# Patient Record
Sex: Male | Born: 1935 | Race: White | Hispanic: No | Marital: Married | Smoking: Never smoker
Health system: Southern US, Community
[De-identification: ages and names within clinical notes are randomized; demographics above are authoritative.]

## PROBLEM LIST (undated history)

## (undated) DIAGNOSIS — N189 Chronic kidney disease, unspecified: Secondary | ICD-10-CM

## (undated) DIAGNOSIS — I10 Essential (primary) hypertension: Secondary | ICD-10-CM

## (undated) DIAGNOSIS — E119 Type 2 diabetes mellitus without complications: Secondary | ICD-10-CM

## (undated) DIAGNOSIS — R011 Cardiac murmur, unspecified: Secondary | ICD-10-CM

## (undated) DIAGNOSIS — C801 Malignant (primary) neoplasm, unspecified: Secondary | ICD-10-CM

## (undated) DIAGNOSIS — E785 Hyperlipidemia, unspecified: Secondary | ICD-10-CM

## (undated) DIAGNOSIS — I82409 Acute embolism and thrombosis of unspecified deep veins of unspecified lower extremity: Secondary | ICD-10-CM

## (undated) HISTORY — DX: Acute embolism and thrombosis of unspecified deep veins of unspecified lower extremity: I82.409

## (undated) HISTORY — DX: Essential (primary) hypertension: I10

## (undated) HISTORY — PX: PROSTATE SURGERY: SHX751

## (undated) HISTORY — PX: REPLACEMENT TOTAL KNEE: SUR1224

## (undated) HISTORY — DX: Hyperlipidemia, unspecified: E78.5

## (undated) HISTORY — DX: Malignant (primary) neoplasm, unspecified: C80.1

---

## 2002-05-26 ENCOUNTER — Ambulatory Visit (HOSPITAL_BASED_OUTPATIENT_CLINIC_OR_DEPARTMENT_OTHER): Admission: RE | Admit: 2002-05-26 | Discharge: 2002-05-26 | Payer: Self-pay | Admitting: Plastic Surgery

## 2010-11-16 DIAGNOSIS — I824Z9 Acute embolism and thrombosis of unspecified deep veins of unspecified distal lower extremity: Secondary | ICD-10-CM

## 2010-11-16 DIAGNOSIS — Z86711 Personal history of pulmonary embolism: Secondary | ICD-10-CM

## 2010-11-16 HISTORY — DX: Personal history of pulmonary embolism: Z86.711

## 2010-11-16 HISTORY — DX: Acute embolism and thrombosis of unspecified deep veins of unspecified distal lower extremity: I82.4Z9

## 2021-12-19 ENCOUNTER — Other Ambulatory Visit (HOSPITAL_COMMUNITY): Payer: Self-pay | Admitting: Specialist

## 2021-12-19 DIAGNOSIS — Z86718 Personal history of other venous thrombosis and embolism: Secondary | ICD-10-CM

## 2021-12-22 ENCOUNTER — Ambulatory Visit (HOSPITAL_COMMUNITY)
Admission: RE | Admit: 2021-12-22 | Discharge: 2021-12-22 | Disposition: A | Discharge status: Home / Self Care | Payer: Medicare HMO | Source: Ambulatory Visit | Attending: Cardiology | Admitting: Cardiology

## 2021-12-22 ENCOUNTER — Other Ambulatory Visit: Payer: Self-pay

## 2021-12-22 DIAGNOSIS — Z86718 Personal history of other venous thrombosis and embolism: Secondary | ICD-10-CM | POA: Diagnosis not present

## 2021-12-22 NOTE — Progress Notes (Signed)
[facility] OF [place]  ASSESSMENT / PLAN: 86 y.o. male with history of deep venous thrombosis, with plans to undergo neurosurgery in the near future.  Patient was offered a temporary inferior vena cava filter, we will place this Friday 12/26/21.  I counseled him that the filter should be removed as soon as possible (i.e. when he is ambulatory and recovered from a surgical standpoint).  He should receive pharmacologic DVT prophylaxis while in the hospital at [name] discretion.  CHIEF COMPLAINT: History of DVT/PE.  Plans for back surgery  HISTORY OF PRESENT ILLNESS: Ivan [name] is a 86 y.o. male with left lower extremity weakness from spinal stenosis.  He is planned to undergo surgical correction with Dr. Maxie Better in the near future.  He has a history of DVT and PE after orthopedic surgery in the past.  He was treated successfully with anticoagulation.  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  I was asked to evaluate the patient to consider temporary inferior vena cava filter placement.  I think the patient is a good candidate for this.  I explained the rationale, risks, benefits, and alternatives to IVC filter placement.  The patient is understanding and amenable with proceeding.  Past Medical History:  Diagnosis Date   Cancer ([place])    DVT (deep venous thrombosis) ([place])    Hyperlipidemia    Hypertension     Past Surgical History:  Procedure Laterality Date   PROSTATE SURGERY N/A    REPLACEMENT TOTAL KNEE Bilateral     History reviewed. No pertinent family history.  Social History   Socioeconomic History   Marital status: Unknown    Spouse name: Not on file   Number of children: Not on file   Years of education: Not on file   Highest education level: Not on file  Occupational History   Not on file  Tobacco Use   Smoking status: Never   Smokeless tobacco: Never  Substance and Sexual Activity   Alcohol use: Not  Currently   Drug use: Not on file   Sexual activity: Not on file  Other Topics Concern   Not on file  Social History Narrative   Not on file   Social Determinants of Health   Financial Resource Strain: Not on file  Food Insecurity: Not on file  Transportation Needs: Not on file  Physical Activity: Not on file  Stress: Not on file  Social Connections: Not on file  Intimate Partner Violence: Not on file    Allergies  Allergen Reactions   Oxycontin [Oxycodone]     Current Outpatient Medications  Medication Sig Dispense Refill   amLODipine (NORVASC) 10 MG tablet Take 10 mg by mouth daily.     ASPIRIN 81 PO aspirin 81 mg capsule  Take 1 capsule every day by oral route.     atorvastatin (LIPITOR) 20 MG tablet Take 20 mg by mouth daily.     carvedilol (COREG) 25 MG tablet Take 25 mg by mouth 2 (two) times daily.     dorzolamide (TRUSOPT) 2 % ophthalmic solution 1 drop 2 (two) times daily.     glimepiride (AMARYL) 4 MG tablet Take 4 mg by mouth 2 (two) times daily.     hydrochlorothiazide (HYDRODIURIL) 25 MG tablet Take 25 mg by mouth daily.     irbesartan (AVAPRO) 300 MG tablet Take 300 mg by mouth daily.     latanoprost (XALATAN) 0.005 % ophthalmic solution 1 drop  at bedtime.     losartan (COZAAR) 100 MG tablet Take 100 mg by mouth daily.     timolol (TIMOPTIC) 0.5 % ophthalmic solution 1 drop 2 (two) times daily.     No current facility-administered medications for this visit.    REVIEW OF SYSTEMS:  [X]  denotes positive finding, [ ]  denotes negative finding Cardiac  Comments:  Chest pain or chest pressure:    Shortness of breath upon exertion:    Short of breath when lying flat:    Irregular heart rhythm:        Vascular    Pain in calf, thigh, or hip brought on by ambulation:    Pain in feet at night that wakes you up from your sleep:     Blood clot in your veins:    Leg swelling:         Pulmonary    Oxygen at home:    Productive cough:     Wheezing:          Neurologic    Sudden weakness in arms or legs:     Sudden numbness in arms or legs:     Sudden onset of difficulty speaking or slurred speech:    Temporary loss of vision in one eye:     Problems with dizziness:         Gastrointestinal    Blood in stool:     Vomited blood:         Genitourinary    Burning when urinating:     Blood in urine:        Psychiatric    Major depression:         Hematologic    Bleeding problems:    Problems with blood clotting too easily:        Skin    Rashes or ulcers:        Constitutional    Fever or chills:      PHYSICAL EXAM Vitals:   12/23/21 1258  BP: 134/78  Pulse: 60  Resp: 20  Temp: 98.5 F (36.9 C)  SpO2: 97%  Weight: 215 lb (97.5 kg)  Height: 5\' 9"  (1.753 m)    Constitutional: well appearing in no distress. Appears well nourished.  Neurologic: CN intact. Left leg weak with hip flexion. Psychiatric: Mood and affect symmetric and appropriate. Eyes: No icterus. No conjunctival pallor. Ears, nose, throat: mucous membranes moist. Midline trachea.  Cardiac: regular rate and rhythm.  Respiratory: unlabored. Abdominal: soft, non-tender, non-distended.  Extremity: No edema. No cyanosis. No pallor.  Skin: No gangrene. No ulceration.  Lymphatic: No Stemmer's sign. No palpable lymphadenopathy.  PERTINENT LABORATORY AND RADIOLOGIC DATA Bilateral lower extremity venous duplex RIGHT:  - No evidence of deep vein thrombosis in the lower extremity. No indirect  evidence of obstruction proximal to the inguinal ligament.  - No cystic structure found in the popliteal fossa.  - Rouleau flow noted in the popliteal vein.     LEFT:  - Findings consistent with chronic deep vein thrombosis involving the left  femoral vein.  - No cystic structure found in the popliteal fossa.      *See table(s) above for measurements and observations.   [name]. Stanford Breed, MD [facility] Phone Number: [phone] 12/23/2021 1:30 PM  Total time spent on preparing this encounter including chart review, data review, collecting history, examining the patient, coordinating care for this new patient, 45 minutes.  Portions of this report  may have been transcribed using voice recognition software.  Every effort has been made to ensure accuracy; however, inadvertent computerized transcription errors may still be present.

## 2021-12-22 NOTE — H&P (View-Only) (Signed)
[facility] OF [place]  ASSESSMENT / PLAN: 86 y.o. male with history of deep venous thrombosis, with plans to undergo neurosurgery in the near future.  Patient was offered a temporary inferior vena cava filter, we will place this Friday 12/26/21.  I counseled him that the filter should be removed as soon as possible (i.e. when he is ambulatory and recovered from a surgical standpoint).  He should receive pharmacologic DVT prophylaxis while in the hospital at [name] discretion.  CHIEF COMPLAINT: History of DVT/PE.  Plans for back surgery  HISTORY OF PRESENT ILLNESS: Ivan [name] is a 86 y.o. male with left lower extremity weakness from spinal stenosis.  He is planned to undergo surgical correction with Dr. Maxie Better in the near future.  He has a history of DVT and PE after orthopedic surgery in the past.  He was treated successfully with anticoagulation.  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  I was asked to evaluate the patient to consider temporary inferior vena cava filter placement.  I think the patient is a good candidate for this.  I explained the rationale, risks, benefits, and alternatives to IVC filter placement.  The patient is understanding and amenable with proceeding.  Past Medical History:  Diagnosis Date   Cancer ([place])    DVT (deep venous thrombosis) ([place])    Hyperlipidemia    Hypertension     Past Surgical History:  Procedure Laterality Date   PROSTATE SURGERY N/A    REPLACEMENT TOTAL KNEE Bilateral     History reviewed. No pertinent family history.  Social History   Socioeconomic History   Marital status: Unknown    Spouse name: Not on file   Number of children: Not on file   Years of education: Not on file   Highest education level: Not on file  Occupational History   Not on file  Tobacco Use   Smoking status: Never   Smokeless tobacco: Never  Substance and Sexual Activity   Alcohol use: Not  Currently   Drug use: Not on file   Sexual activity: Not on file  Other Topics Concern   Not on file  Social History Narrative   Not on file   Social Determinants of Health   Financial Resource Strain: Not on file  Food Insecurity: Not on file  Transportation Needs: Not on file  Physical Activity: Not on file  Stress: Not on file  Social Connections: Not on file  Intimate Partner Violence: Not on file    Allergies  Allergen Reactions   Oxycontin [Oxycodone]     Current Outpatient Medications  Medication Sig Dispense Refill   amLODipine (NORVASC) 10 MG tablet Take 10 mg by mouth daily.     ASPIRIN 81 PO aspirin 81 mg capsule  Take 1 capsule every day by oral route.     atorvastatin (LIPITOR) 20 MG tablet Take 20 mg by mouth daily.     carvedilol (COREG) 25 MG tablet Take 25 mg by mouth 2 (two) times daily.     dorzolamide (TRUSOPT) 2 % ophthalmic solution 1 drop 2 (two) times daily.     glimepiride (AMARYL) 4 MG tablet Take 4 mg by mouth 2 (two) times daily.     hydrochlorothiazide (HYDRODIURIL) 25 MG tablet Take 25 mg by mouth daily.     irbesartan (AVAPRO) 300 MG tablet Take 300 mg by mouth daily.     latanoprost (XALATAN) 0.005 % ophthalmic solution 1 drop  at bedtime.     losartan (COZAAR) 100 MG tablet Take 100 mg by mouth daily.     timolol (TIMOPTIC) 0.5 % ophthalmic solution 1 drop 2 (two) times daily.     No current facility-administered medications for this visit.    REVIEW OF SYSTEMS:  [X]  denotes positive finding, [ ]  denotes negative finding Cardiac  Comments:  Chest pain or chest pressure:    Shortness of breath upon exertion:    Short of breath when lying flat:    Irregular heart rhythm:        Vascular    Pain in calf, thigh, or hip brought on by ambulation:    Pain in feet at night that wakes you up from your sleep:     Blood clot in your veins:    Leg swelling:         Pulmonary    Oxygen at home:    Productive cough:     Wheezing:          Neurologic    Sudden weakness in arms or legs:     Sudden numbness in arms or legs:     Sudden onset of difficulty speaking or slurred speech:    Temporary loss of vision in one eye:     Problems with dizziness:         Gastrointestinal    Blood in stool:     Vomited blood:         Genitourinary    Burning when urinating:     Blood in urine:        Psychiatric    Major depression:         Hematologic    Bleeding problems:    Problems with blood clotting too easily:        Skin    Rashes or ulcers:        Constitutional    Fever or chills:      PHYSICAL EXAM Vitals:   12/23/21 1258  BP: 134/78  Pulse: 60  Resp: 20  Temp: 98.5 F (36.9 C)  SpO2: 97%  Weight: 215 lb (97.5 kg)  Height: 5\' 9"  (1.753 m)    Constitutional: well appearing in no distress. Appears well nourished.  Neurologic: CN intact. Left leg weak with hip flexion. Psychiatric: Mood and affect symmetric and appropriate. Eyes: No icterus. No conjunctival pallor. Ears, nose, throat: mucous membranes moist. Midline trachea.  Cardiac: regular rate and rhythm.  Respiratory: unlabored. Abdominal: soft, non-tender, non-distended.  Extremity: No edema. No cyanosis. No pallor.  Skin: No gangrene. No ulceration.  Lymphatic: No Stemmer's sign. No palpable lymphadenopathy.  PERTINENT LABORATORY AND RADIOLOGIC DATA Bilateral lower extremity venous duplex RIGHT:  - No evidence of deep vein thrombosis in the lower extremity. No indirect  evidence of obstruction proximal to the inguinal ligament.  - No cystic structure found in the popliteal fossa.  - Rouleau flow noted in the popliteal vein.     LEFT:  - Findings consistent with chronic deep vein thrombosis involving the left  femoral vein.  - No cystic structure found in the popliteal fossa.      *See table(s) above for measurements and observations.   Ivan [name]. Stanford Breed, MD [facility] Phone Number: [phone] 12/23/2021 1:30 PM  Total time spent on preparing this encounter including chart review, data review, collecting history, examining the patient, coordinating care for this new patient, 45 minutes.  Portions of this report  may have been transcribed using voice recognition software.  Every effort has been made to ensure accuracy; however, inadvertent computerized transcription errors may still be present.

## 2021-12-23 ENCOUNTER — Other Ambulatory Visit: Payer: Self-pay

## 2021-12-23 ENCOUNTER — Encounter: Payer: Self-pay | Admitting: Vascular Surgery

## 2021-12-23 ENCOUNTER — Ambulatory Visit: Payer: Medicare HMO | Admitting: Vascular Surgery

## 2021-12-23 VITALS — BP 134/78 | HR 60 | Temp 98.5°F | Resp 20 | Ht 69.0 in | Wt 215.0 lb

## 2021-12-23 DIAGNOSIS — Z86718 Personal history of other venous thrombosis and embolism: Secondary | ICD-10-CM | POA: Diagnosis not present

## 2021-12-26 ENCOUNTER — Ambulatory Visit (HOSPITAL_COMMUNITY)
Admission: RE | Admit: 2021-12-26 | Discharge: 2021-12-26 | Disposition: A | Discharge status: Home / Self Care | Payer: Medicare HMO | Attending: Vascular Surgery | Admitting: Vascular Surgery

## 2021-12-26 ENCOUNTER — Other Ambulatory Visit: Payer: Self-pay

## 2021-12-26 ENCOUNTER — Encounter (HOSPITAL_COMMUNITY)
Admission: RE | Disposition: A | Discharge status: Home / Self Care | Payer: Self-pay | Source: Home / Self Care | Attending: Vascular Surgery

## 2021-12-26 DIAGNOSIS — Z86718 Personal history of other venous thrombosis and embolism: Secondary | ICD-10-CM | POA: Insufficient documentation

## 2021-12-26 DIAGNOSIS — M4800 Spinal stenosis, site unspecified: Secondary | ICD-10-CM | POA: Diagnosis not present

## 2021-12-26 DIAGNOSIS — Z408 Encounter for other prophylactic surgery: Secondary | ICD-10-CM | POA: Insufficient documentation

## 2021-12-26 HISTORY — PX: IVC VENOGRAPHY: CATH118301

## 2021-12-26 HISTORY — PX: IVC FILTER INSERTION: CATH118245

## 2021-12-26 LAB — POCT I-STAT, CHEM 8
BUN: 27 mg/dL — ABNORMAL HIGH (ref 8–23)
Calcium, Ion: 1.08 mmol/L — ABNORMAL LOW (ref 1.15–1.40)
Chloride: 96 mmol/L — ABNORMAL LOW (ref 98–111)
Creatinine, Ser: 1.5 mg/dL — ABNORMAL HIGH (ref 0.61–1.24)
Glucose, Bld: 254 mg/dL — ABNORMAL HIGH (ref 70–99)
HCT: 36 % — ABNORMAL LOW (ref 39.0–52.0)
Hemoglobin: 12.2 g/dL — ABNORMAL LOW (ref 13.0–17.0)
Potassium: 3.3 mmol/L — ABNORMAL LOW (ref 3.5–5.1)
Sodium: 132 mmol/L — ABNORMAL LOW (ref 135–145)
TCO2: 25 mmol/L (ref 22–32)

## 2021-12-26 SURGERY — IVC FILTER INSERTION
Anesthesia: LOCAL

## 2021-12-26 MED ORDER — FENTANYL CITRATE (PF) 100 MCG/2ML IJ SOLN
INTRAMUSCULAR | Status: AC
  Filled 2021-12-26: qty 2

## 2021-12-26 MED ORDER — ONDANSETRON HCL 4 MG/2ML IJ SOLN: 4.0000 mg | Freq: Four times a day (QID) | INTRAMUSCULAR | Status: DC | PRN

## 2021-12-26 MED ORDER — SODIUM CHLORIDE 0.9 % WEIGHT BASED INFUSION: 1.0000 mL/kg/h | INTRAVENOUS | Status: DC

## 2021-12-26 MED ORDER — HEPARIN (PORCINE) IN NACL 1000-0.9 UT/500ML-% IV SOLN
INTRAVENOUS | Status: DC | PRN
  Administered 2021-12-26: 500 mL

## 2021-12-26 MED ORDER — HYDRALAZINE HCL 20 MG/ML IJ SOLN: 5.0000 mg | INTRAMUSCULAR | Status: DC | PRN

## 2021-12-26 MED ORDER — ACETAMINOPHEN 325 MG PO TABS: 650.0000 mg | ORAL_TABLET | ORAL | Status: DC | PRN

## 2021-12-26 MED ORDER — MIDAZOLAM HCL 2 MG/2ML IJ SOLN
INTRAMUSCULAR | Status: DC | PRN
  Administered 2021-12-26: 1 mg via INTRAVENOUS

## 2021-12-26 MED ORDER — IODIXANOL 320 MG/ML IV SOLN
INTRAVENOUS | Status: DC | PRN
  Administered 2021-12-26: 50 mL

## 2021-12-26 MED ORDER — LIDOCAINE HCL (PF) 1 % IJ SOLN
INTRAMUSCULAR | Status: DC | PRN
  Administered 2021-12-26: 5 mL

## 2021-12-26 MED ORDER — MIDAZOLAM HCL 2 MG/2ML IJ SOLN
INTRAMUSCULAR | Status: AC
  Filled 2021-12-26: qty 2

## 2021-12-26 MED ORDER — SODIUM CHLORIDE 0.9% FLUSH: 3.0000 mL | Freq: Two times a day (BID) | INTRAVENOUS | Status: DC

## 2021-12-26 MED ORDER — HEPARIN (PORCINE) IN NACL 1000-0.9 UT/500ML-% IV SOLN
INTRAVENOUS | Status: AC
  Filled 2021-12-26: qty 500

## 2021-12-26 MED ORDER — LABETALOL HCL 5 MG/ML IV SOLN: 10.0000 mg | INTRAVENOUS | Status: DC | PRN

## 2021-12-26 MED ORDER — SODIUM CHLORIDE 0.9% FLUSH: 3.0000 mL | INTRAVENOUS | Status: DC | PRN

## 2021-12-26 MED ORDER — LIDOCAINE HCL (PF) 1 % IJ SOLN
INTRAMUSCULAR | Status: AC
  Filled 2021-12-26: qty 30

## 2021-12-26 MED ORDER — SODIUM CHLORIDE 0.9 % IV SOLN: 250.0000 mL | INTRAVENOUS | Status: DC | PRN

## 2021-12-26 MED ORDER — SODIUM CHLORIDE 0.9 % IV SOLN: INTRAVENOUS | Status: DC

## 2021-12-26 MED ORDER — FENTANYL CITRATE (PF) 100 MCG/2ML IJ SOLN
INTRAMUSCULAR | Status: DC | PRN
  Administered 2021-12-26: 50 ug via INTRAVENOUS

## 2021-12-26 SURGICAL SUPPLY — 16 items
BAG SNAP BAND KOVER 36X36 (MISCELLANEOUS) ×1 IMPLANT
CATH ANGIO 5F BER2 65CM (CATHETERS) ×1 IMPLANT
FILTER CELECT-JUGULAR (Filter) ×1 IMPLANT
GUIDEWIRE ANGLED .035X150CM (WIRE) ×1 IMPLANT
KIT MICROPUNCTURE NIT STIFF (SHEATH) ×1 IMPLANT
KIT PV (KITS) ×2 IMPLANT
SHEATH PINNACLE 5F 10CM (SHEATH) ×1 IMPLANT
SHEATH PROBE COVER 6X72 (BAG) ×1 IMPLANT
STOPCOCK MORSE 400PSI 3WAY (MISCELLANEOUS) ×1 IMPLANT
SYR MEDRAD MARK 7 150ML (SYRINGE) ×2 IMPLANT
TRANSDUCER W/STOPCOCK (MISCELLANEOUS) ×2 IMPLANT
TRAY PV CATH (CUSTOM PROCEDURE TRAY) ×2 IMPLANT
TUBING CIL FLEX 10 FLL-RA (TUBING) ×1 IMPLANT
TUBING CONTRAST HIGH PRESS 48 (TUBING) ×1 IMPLANT
WIRE BENTSON .035X145CM (WIRE) ×1 IMPLANT
WIRE ROSEN-J .035X260CM (WIRE) ×1 IMPLANT

## 2021-12-26 NOTE — Op Note (Signed)
DATE OF SERVICE: 12/26/2021  PATIENT:  Ivan [name]  86 y.o. male  PRE-OPERATIVE DIAGNOSIS:  history of DVT, plan for back surgery in near future  POST-OPERATIVE DIAGNOSIS:  Same  PROCEDURE:   1) Ultrasound guided right internal jugular vein access 2) Central venogram (30m total contrast) 3) Placement of inferior vena cava filter (Cook Celect) 4) Conscious sedation (31 minutes)  SURGEON:  TYevonne Aline HStanford Breed MD  ASSISTANT: none  ANESTHESIA:   local and IV sedation  ESTIMATED BLOOD LOSS: minimal  LOCAL MEDICATIONS USED:  LIDOCAINE   COUNTS: confirmed correct.  PATIENT DISPOSITION:  PACU - hemodynamically stable.   Delay start of Pharmacological VTE agent (>24hrs) due to surgical blood loss or risk of bleeding: no  INDICATION FOR PROCEDURE: Ivan Mceversis a 86y.o. male with history of deep venous thrombosis and pulmonary embolism. He needs to undergo spinal surgery. After careful discussion of risks, benefits, and alternatives the patient was offered inferior vena cava filter placement. The patient understood and wished to proceed.  OPERATIVE FINDINGS:  Unremarkable central venogram.  IVC filter placed into low IVC just above confluence of iliac veins.  DESCRIPTION OF PROCEDURE: After identification of the patient in the pre-operative holding area, the patient was transferred to the operating room. The patient was positioned supine on the operating room table. Anesthesia was induced. The groins was prepped and draped in standard fashion. A surgical pause was performed confirming correct patient, procedure, and operative location.  The right neck was anesthetized with subcutaneous injection of 1% lidocaine. Using ultrasound guidance, the right jugular vein was accessed with micropuncture technique. Fluoroscopy was used to confirm cannulation over the femoral head. The 28F sheath was upsized to 89F.   A Benson wire was advanced into the distal IVC. The sheath was removed.  A  Cook IVC filter kit was brought to the field and prepped further [employer]instructions.  The system was delivered over the wire into the inferior vena cava.  A central venogram was performed.  This confirmed position and size of the inferior vena cava was appropriate for the system.  The dilator was then removed.  Through this sheath, the inferior vena cava filter was delivered into the low inferior vena cava.  The sheath was slowly withdrawn.  The filter was placed without difficulty and without tilt.  A follow-up venogram was performed.  This showed the filter in good position.  Satisfied we removed all endovascular equipment.  Manual pressure was held over the access site until hemostasis was achieved.  A sterile bandage was applied.  Conscious sedation was administered with the use of IV fentanyl and midazolam under continuous physician and nurse monitoring.  Heart rate, blood pressure, and oxygen saturation were continuously monitored.  Total sedation time was 31 minutes  Upon completion of the case instrument and sharps counts were confirmed correct. The patient was transferred to the PACU in good condition. I was present for all portions of the procedure.  PLAN: Okay to proceed with spinal surgery at any point from my standpoint.  Patient should start pharmacologic DVT prophylaxis as soon as possible after surgery at Dr. BReather Littlerdiscretion.  We will make an appointment for IVC filter retrieval in 3 months time.  TYevonne Aline HStanford Breed MD [facility]: ([phone]/08/2022 1:02 PM

## 2021-12-26 NOTE — Interval H&P Note (Signed)
History and Physical Interval Note:  12/26/2021 12:58 PM  Ivan [name]  has presented today for surgery, with the diagnosis of history of dvt.  The various methods of treatment have been discussed with the patient and family. After consideration of risks, benefits and other options for treatment, the patient has consented to  Procedure(s): IVC FILTER INSERTION (N/A) as a surgical intervention.  The patient's history has been reviewed, patient examined, no change in status, stable for surgery.  I have reviewed the patient's chart and labs.  Questions were answered to the patient's satisfaction.     Cherre Robins

## 2021-12-29 ENCOUNTER — Encounter (HOSPITAL_COMMUNITY): Payer: Self-pay | Admitting: Vascular Surgery

## 2022-01-07 ENCOUNTER — Other Ambulatory Visit: Payer: Self-pay

## 2022-01-07 ENCOUNTER — Ambulatory Visit: Payer: Medicare HMO | Admitting: Cardiology

## 2022-01-07 ENCOUNTER — Encounter: Payer: Self-pay | Admitting: Cardiology

## 2022-01-07 VITALS — BP 158/60 | HR 65 | Ht 69.0 in | Wt 212.8 lb

## 2022-01-07 DIAGNOSIS — E1169 Type 2 diabetes mellitus with other specified complication: Secondary | ICD-10-CM | POA: Insufficient documentation

## 2022-01-07 DIAGNOSIS — Z01810 Encounter for preprocedural cardiovascular examination: Secondary | ICD-10-CM | POA: Insufficient documentation

## 2022-01-07 DIAGNOSIS — R011 Cardiac murmur, unspecified: Secondary | ICD-10-CM | POA: Diagnosis not present

## 2022-01-07 DIAGNOSIS — I452 Bifascicular block: Secondary | ICD-10-CM

## 2022-01-07 DIAGNOSIS — E785 Hyperlipidemia, unspecified: Secondary | ICD-10-CM

## 2022-01-07 DIAGNOSIS — I10 Essential (primary) hypertension: Secondary | ICD-10-CM | POA: Insufficient documentation

## 2022-01-07 DIAGNOSIS — I82502 Chronic embolism and thrombosis of unspecified deep veins of left lower extremity: Secondary | ICD-10-CM | POA: Insufficient documentation

## 2022-01-07 NOTE — Patient Instructions (Addendum)
Medication Instructions:   No changes  *If you need a refill on your cardiac medications before your next appointment, please call your pharmacy*   Lab Work: Not needed    Testing/Procedures:  Will be schedule at [place] has requested that you have an echocardiogram. Echocardiography is a painless test that uses sound waves to create images of your heart. It provides your doctor with information about the size and shape of your heart and how well your hearts chambers and valves are working. This procedure takes approximately one hour. There are no restrictions for this procedure.    Follow-Up: At [facility], you and your health needs are our priority.  As part of our continuing mission to provide you with exceptional heart care, we have created designated Provider Care Teams.  These Care Teams include your primary Cardiologist (physician) and Advanced Practice Providers (APPs -  Physician Assistants and Nurse Practitioners) who all work together to provide you with the care you need, when you need it.  We recommend signing up for the patient portal called "MyChart".  Sign up information is provided on this After Visit Summary.  MyChart is used to connect with patients for Virtual Visits (Telemedicine).  Patients are able to view lab/test results, encounter notes, upcoming appointments, etc.  Non-urgent messages can be sent to your provider as well.   To learn more about what you can do with MyChart, go to [email or web address].    Your next appointment:    1 month(s)  The format for your next appointment:   In Person  Provider:   [name], MD

## 2022-01-07 NOTE — Progress Notes (Signed)
Primary Care Provider: Wayland Salinas, MD [facility] Cardiologist: [name], MD Electrophysiologist: None  Clinic Note: Chief Complaint  Patient presents with   Pre-op Exam    Upcoming back surgery for spinal stenosis.   Abnormal ECG    Bifascicular block on EKG-not new    ===================================  ASSESSMENT/PLAN   Problem List Items Addressed This Visit       Cardiology Problems   Bifascicular bundle branch block (Chronic)    Conduction delay noted.  This does not seem to be new.  Was described in 2018 as well.  We are checking 2D echo just to make sure there is no structural abnormalities to explain it.  With 1 AV block, this could be explained by the beta-blocker.  He has been on a stable dose for a long time.  Would probably not change.  Simply need to monitor for signs of complete block.  No current indication for pacemaker placement.      Relevant Orders   EKG 12-Lead (Completed)   ECHOCARDIOGRAM COMPLETE   Essential hypertension (Chronic)    His pressures seem to be somewhat up-and-down.  Better at home and better at recent clinic visit.  On high doses of amlodipine, carvedilol and irbesartan along with increased dose of HCTZ.  Based on the fact that he does have some unsteady gait with spinal stenosis, and does have documented blood pressure range ranging from the 120s to 150s, I would not want to be more aggressive than this.      Hyperlipidemia associated with type 2 diabetes mellitus ([place]) (Chronic)    Lipids have been pretty well controlled, not sure what happened between beginning and end of 2022.  However he seems to be on stable dose of atorvastatin.  Labs being followed by PCP.  Stable.      Leg DVT (deep venous thromboembolism), chronic, left (HCC) (Chronic)    Chronic left femoral vein thrombosis.  Not unexpectedly there is evidence of DVT because he never treated had full [place] treatment.  Now status post IVC  filter.  86 year old DVT is likely not mobile, probably more laminated.  Likely not an indication for anticoagulation going forward.  Simply continue aspirin 81 mg which is okay to hold 5 days prior to procedures or surgeries.        Other   Systolic ejection murmur    Systolic ejection murmur heard by exam, sounds like is probably an aortic murmur.  Just need to determine if it is simply aortic sclerosis versus significant stenosis.  Plan: Check 2D echocardiogram as part of preop evaluation.      Preoperative cardiovascular examination    86 year old gentleman by definition has an underlying risk with age for anesthesia related difficulties.  However, from a cardiac standpoint, he really has no significant risk factors.  Although he has renal insufficiency, creatinine is less than 2 and stable.  He has diabetes but not on insulin.  No active angina or CHF symptoms (only trivial left leg only swelling).  No history of stroke or documented cerebrovascular disease. Planned surgery is spinal, and not likely to put undue stress on the heart from a vascular standpoint.  As such, in a patient without active cardiac symptoms I would not recommend any ischemic evaluation as it may only serve to delay unnecessary procedure.  In the absence of surgery, I would not check a stress test, therefore no requirement at this point. I am checking a 2D echocardiogram because of the murmur  just to ensure that is not significant aortic stenosis which would adjust his restratification.  This will also give [place] a sense of the ejection fraction and any potential wall motion abnormalities.  Provided the echocardiogram is relatively normal, I see no reason for him not to proceed with his upcoming surgery.  No further ischemic evaluation warranted unless echocardiogram findings warrant.      Other Visit Diagnoses     Murmur    -  Primary   Relevant Orders   EKG 12-Lead (Completed)   ECHOCARDIOGRAM COMPLETE        ===================================  HPI:    Frederic Tones is a 86 y.o. male With History of Chronic Left Femoral Vein DVT (original DVT-PE in 2012) and Spinal Stenosis is being seen today for the preop evaluation at the request of Susa Day, MD. -> Recent history of DVT with plans undergo neurosurgery in the near future.  Adger Cantera was seen on December 04, 2021 by his PCP.  He had been having some issues with blood pressure, and HCTZ was increased to 25 mg daily Labs are being evaluated because of mild exacerbation of CKD that is stabilized. => Converted from losartan to irbesartan 300 mg.  Following this, he was seen by Dr. [name] for back pain.  Due to concerns with chronic DVT, lower extremity Dopplers were ordered which did show chronic left femoral vein DVT. => Patient was then referred to Dr. Stanford Breed ([facility]) for placement of IVC filter. Also he was referred to cardiology for preop evaluation.  Recent Hospitalizations:  IVC filter placed on 12/26/2021.  Reviewed  CV studies:    The following studies were reviewed today: (if available, images/films reviewed: From Epic Chart or Care Everywhere) Lower Extremity Venous Dopplers 12/23/2021: R -no deep venous thrombosis.~ Rouleau flow in Popliteal Vein. L -finding system with chronic DVT in the left femoral vein. Treadmill Stress Echo done in September 2018 -> report not available, but no further action taken.  Based on this, would imagine that there was no significant findings.   Interval History:   Loye Vento is an otherwise relatively healthy 86 year old gentleman who presents for preop evaluation for spine surgery for relief of spinal stenosis.  Over the last several months he is now extremely limited as far as the angulation and mobility using a walker.  His left leg is extremely weak and.  Prior to this decompensation, he was relatively active walking about a mile or 2 almost every day.  He is still trying to do this  but it is very difficult with a walker.  With the level of activity that he does, he denies any chest pain pressure or dyspnea within the rest or exertion.  No PND or orthopnea.  He only has minimal swelling on the left leg.  He tells me that he had Lovenox shots for 10 days after the DVT-PE in 2012, never was on warfarin or DOAC.  He has only been on aspirin.  He has not had any further issues of significant swelling in the left leg or recurrent PE.  Denies any rapid irregular heartbeats palpitations.  No syncope or near syncope, no TIA or BX.  No claudication  He has no history of cerebrovascular disease.  He does not diabetes, but is only on glimepiride.  He does have CKD 3, baseline creatinine is somewhere between 1.5 and 1.7. <2.0.  His blood pressure is little high today, but he tells me that at home it ranges in the 120  to 130 mmHg range.  At his PCPs office it was 122/68.  His EKG here today shows Sinus Rhythm with 1 AVB, RBBB and LAFB with borderline LVH criteria.  This appears to be stable compared to reports of an EKG from July 2018.  REVIEWED OF SYSTEMS   Review of Systems  Constitutional:  Positive for weight loss. Negative for malaise/fatigue (He feels lazy, because he has to be still limited in his activity related to his spinal stenosis.).  HENT:  Negative for congestion and nosebleeds.   Respiratory:  Negative for cough, shortness of breath and wheezing.   Cardiovascular:        Per HPI.  Gastrointestinal:  Negative for blood in stool and melena.  Genitourinary:  Negative for hematuria.  Musculoskeletal:  Positive for back pain and joint pain. Negative for falls.  Neurological:  Positive for tingling (Mostly left leg-gets essentially numb and fully weak.) and focal weakness (Left leg affected more by the spinal stenosis on the right leg.). Negative for dizziness.  Psychiatric/Behavioral:  Negative for depression and memory loss. The patient is not nervous/anxious and does  not have insomnia.    I have reviewed and (if needed) personally updated the patient's problem list, medications, allergies, past medical and surgical history, social and family history.   PAST MEDICAL HISTORY   Past Medical History:  Diagnosis Date   Cancer [facility])    Hx pulmonary embolism 2012   Left leg DVT-PE (presumably was postop) -> Per patient and wife, he was given 10 days of injections but not placed on DOAC or warfarin; only on aspirin 81 mg   Hyperlipidemia    Hypertension    Lower leg DVT (deep venous thrombosis) ([place]) 2012   -> Noted to have chronic left femoral vein DVT on LEV Dopplers January 2023    PAST SURGICAL HISTORY   Past Surgical History:  Procedure Laterality Date   IVC FILTER INSERTION N/A 12/26/2021   Procedure: IVC FILTER INSERTION;  Surgeon: Cherre Robins, MD;  Location: [place] [facility];  Service: Cardiovascular;  Laterality: N/A;   IVC VENOGRAPHY N/A 12/26/2021   Procedure: IVC Venography;  Surgeon: Cherre Robins, MD;  Location: [facility] [facility];  Service: Cardiovascular;  Laterality: N/A;   PROSTATE SURGERY N/A    REPLACEMENT TOTAL KNEE Bilateral      There is no immunization history on file for this patient.  MEDICATIONS/ALLERGIES   Current Meds  Medication Sig   amLODipine (NORVASC) 10 MG tablet Take 10 mg by mouth in the morning.   Ascorbic Acid (VITAMIN C) 1000 MG tablet Take 1,000 mg by mouth in the morning.   aspirin 81 MG EC tablet Take 81 mg by mouth in the morning.   atorvastatin (LIPITOR) 20 MG tablet Take 20 mg by mouth in the morning.   carvedilol (COREG) 25 MG tablet Take 25 mg by mouth 2 (two) times daily.   Cholecalciferol (VITAMIN D3 PO) Take 1 tablet by mouth in the morning.   dorzolamide (TRUSOPT) 2 % ophthalmic solution Place 1 drop into both eyes 2 (two) times daily.   glimepiride (AMARYL) 4 MG tablet Take 4 mg by mouth 2 (two) times daily.   hydrochlorothiazide (HYDRODIURIL) 25 MG tablet Take 25 mg by mouth in the  morning.   irbesartan (AVAPRO) 300 MG tablet Take 300 mg by mouth in the morning.   latanoprost (XALATAN) 0.005 % ophthalmic solution Place 1 drop into both eyes at bedtime.   Multiple Vitamin (MULTIVITAMIN  WITH MINERALS) TABS tablet Take 1 tablet by mouth daily. Alive [employer]'s Energy   Probiotic Product (PROBIOTIC PO) Take 1 capsule by mouth in the morning.   timolol (TIMOPTIC) 0.5 % ophthalmic solution Place 1 drop into both eyes 2 (two) times daily.    Allergies  Allergen Reactions   Oxycontin [Oxycodone] Other (See Comments)    hallucinations    SOCIAL HISTORY/FAMILY HISTORY   Reviewed in Epic:   Social History   Tobacco Use   Smoking status: Never   Smokeless tobacco: Never  Substance Use Topics   Alcohol use: Not Currently   Social History   Social History Narrative   Has become somewhat immobilized because of his spinal stenosis, but usually tries to walk 1 to 2 miles a day even with walker.   Family History  Family history unknown: Yes    OBJCTIVE -PE, EKG, labs   Wt Readings from Last 3 Encounters:  01/07/22 212 lb 12.8 oz (96.5 kg)  12/26/21 212 lb (96.2 kg)  12/23/21 215 lb (97.5 kg)    Physical Exam: BP (!) 158/60    Pulse 65    Ht _0  (1.753 m)    Wt 212 lb 12.8 oz (96.5 kg)    SpO2 98%    BMI 31.43 kg/m  Physical Exam Vitals reviewed.  Constitutional:      General: He is not in acute distress.    Appearance: Normal appearance. He is obese. He is not ill-appearing or toxic-appearing.     Comments: Well-nourished, well-groomed.  Healthy-appearing.  Besides the fact that he to walk with a walker, he does appear to be somewhat younger than stated age.  HENT:     Head: Normocephalic and atraumatic.  Neck:     Vascular: No carotid bruit (Radiated systolic ejection murmur).  Cardiovascular:     Rate and Rhythm: Normal rate and regular rhythm. No extrasystoles are present.    Chest Wall: PMI is not displaced.     Pulses: Normal pulses.     Heart  sounds: Heart sounds are distant. Murmur heard.  Harsh crescendo-decrescendo midsystolic murmur is present with a grade of 2/6 at the upper right sternal border radiating to the neck.    No friction rub. No gallop. No S4 sounds.     Comments: Normal S1 with split S2 Pulmonary:     Effort: Pulmonary effort is normal. No respiratory distress.     Breath sounds: Normal breath sounds. No wheezing, rhonchi or rales.  Abdominal:     General: Abdomen is flat. Bowel sounds are normal. There is no distension.     Palpations: Abdomen is soft. There is no mass (No HSM or bruit).     Tenderness: There is no abdominal tenderness.     Comments: Obese-protuberant  Musculoskeletal:        General: Normal range of motion.     Cervical back: Normal range of motion and neck supple.     Right lower leg: Edema (Trivial) present.     Left lower leg: Edema (1+) present.  Skin:    General: Skin is warm and dry.     Coloration: Skin is not jaundiced.  Neurological:     General: No focal deficit present.     Mental Status: He is alert and oriented to person, place, and time.     Gait: Gait abnormal (Uses a walker.  Favors left leg.).  Psychiatric:        Mood and Affect: Mood normal.  Behavior: Behavior normal.        Thought Content: Thought content normal.        Judgment: Judgment normal.     Adult ECG Report  Rate: 65;  Rhythm: normal sinus rhythm and 1  AVB, RBBB, LAFB, LVH-bifascicular block ;   Narrative Interpretation: Reviewed.  Compared to reports from 2018, seems stable.  Recent Labs:  [place] Related to Lipid Panel Component 11/20/21  04/30/21  10/21/20  10/09/19   Cholesterol, Total 147 119 115 120  Triglycerides 101 112 125 116  HDL 46 43 36 Low  38 Low   VLDL Cholesterol Cal _0 LDL 82 56 57 61    No results found for: CHOL, HDL, LDLCALC, LDLDIRECT, TRIG, CHOLHDL Lab Results  Component Value Date   CREATININE 1.50 (H) 12/26/2021   BUN 27  (H) 12/26/2021   NA 132 (L) 12/26/2021   K 3.3 (L) 12/26/2021   CL 96 (L) 12/26/2021   CBC Latest Ref Rng & Units 12/26/2021  Hemoglobin 13.0 - 17.0 g/dL 12.2(L)  Hematocrit 39.0 - 52.0 % 36.0(L)   Comprehensive Metabolic Panel Component 60/73/71  12/04/21  11/20/21  06/05/21  04/30/21  10/21/20   Glucose 270 High  218 High  178 High  245 High  197 High  205 High   BUN 29 High  27 34 High  _1 Creatinine 1.75 High  1.48 High  1.47 High  1.57 High  1.56 High  1.13  eGFR 38 Low  47 Low  47 Low  44 Low  44 Low  --  BUN/Creatinine Ratio _2 Sodium 132 Low  133 Low  134 136 132 Low  136  Potassium 3.7 3.6 4.0 3.9 4.5 4.0  Chloride 91 Low  93 Low  92 Low  95 Low  91 Low  99  CO2 _3 CALCIUM 9.3 9.0 9.4 8.9 9.5 9.2  Total Protein -- -- 6.6     Albumin, Serum -- -- 4.3     Globulin, Total -- -- 2.3     Albumin/Globulin Ratio -- -- 1.9     Total Bilirubin -- -- 0.8     Alkaline Phosphatase -- -- 94     AST -- -- 19     ALT (SGPT) -- -- 25      Hemoglobin A1c Component 11/20/21  04/30/21  10/21/20   Hemoglobin A1c 8.0 High   7.6 High   7.5 High     No results found for: HGBA1C No results found for: TSH  ==================================================  COVID-19 Education: The signs and symptoms of COVID-19 were discussed with the patient and how to seek care for testing (follow up with PCP or arrange E-visit).    I spent a total of 36 minutes with the patient spent in direct patient consultation.  Additional time spent with chart review  / charting (studies, outside notes, etc): 25 min Total Time: 61 min  Current medicines are reviewed at length with the patient today.  (+/- concerns) n/a  This visit occurred during the SARS-CoV-2 public health emergency.  Safety protocols were in place, including screening questions prior to the visit, additional usage of staff PPE, and extensive cleaning of exam room while observing appropriate contact time  as indicated for disinfecting solutions.  Notice: This dictation was prepared with Dragon dictation along with smart phrase technology. Any transcriptional errors that  result from this process are unintentional and may not be corrected upon review.   Studies Ordered:  Orders Placed This Encounter  Procedures   EKG 12-Lead   ECHOCARDIOGRAM COMPLETE    Patient Instructions / Medication Changes & Studies & Tests Ordered   Patient Instructions  Medication Instructions:   No changes  *If you need a refill on your cardiac medications before your next appointment, please call your pharmacy*   Lab Work: Not needed    Testing/Procedures:  Will be schedule at [place] has requested that you have an echocardiogram. Echocardiography is a painless test that uses sound waves to create images of your heart. It provides your doctor with information about the size and shape of your heart and how well your hearts chambers and valves are working. This procedure takes approximately one hour. There are no restrictions for this procedure.    Follow-Up: At [facility], you and your health needs are our priority.  As part of our continuing mission to provide you with exceptional heart care, we have created designated Provider Care Teams.  These Care Teams include your primary Cardiologist (physician) and Advanced Practice Providers (APPs -  Physician Assistants and Nurse Practitioners) who all work together to provide you with the care you need, when you need it.  We recommend signing up for the patient portal called "MyChart".  Sign up information is provided on this After Visit Summary.  MyChart is used to connect with patients for Virtual Visits (Telemedicine).  Patients are able to view lab/test results, encounter notes, upcoming appointments, etc.  Non-urgent messages can be sent to your provider as well.   To learn more about what you can do with MyChart,  go to [email or web address].    Your next appointment:    1 month(s)  The format for your next appointment:   In Person  Provider:   [name], MD       [name], M.D., M.S. Interventional Cardiologist   Pager # [phone] Phone # [phone] [address] [place], [place] [address]   Thank you for choosing Heartcare at [facility]!!

## 2022-01-10 ENCOUNTER — Encounter: Payer: Self-pay | Admitting: Cardiology

## 2022-01-10 NOTE — Assessment & Plan Note (Signed)
86 year old gentleman by definition has an underlying risk with age for anesthesia related difficulties.  However, from a cardiac standpoint, he really has no significant risk factors.  Although he has renal insufficiency, creatinine is less than 2 and stable.  He has diabetes but not on insulin.  No active angina or CHF symptoms (only trivial left leg only swelling).  No history of stroke or documented cerebrovascular disease. Planned surgery is spinal, and not likely to put undue stress on the heart from a vascular standpoint.  As such, in a patient without active cardiac symptoms I would not recommend any ischemic evaluation as it may only serve to delay unnecessary procedure.  In the absence of surgery, I would not check a stress test, therefore no requirement at this point. I am checking a 2D echocardiogram because of the murmur just to ensure that is not significant aortic stenosis which would adjust his restratification.  This will also give [place] a sense of the ejection fraction and any potential wall motion abnormalities.  Provided the echocardiogram is relatively normal, I see no reason for him not to proceed with his upcoming surgery.  No further ischemic evaluation warranted unless echocardiogram findings warrant.

## 2022-01-10 NOTE — Assessment & Plan Note (Signed)
Systolic ejection murmur heard by exam, sounds like is probably an aortic murmur.  Just need to determine if it is simply aortic sclerosis versus significant stenosis.  Plan: Check 2D echocardiogram as part of preop evaluation.

## 2022-01-10 NOTE — Assessment & Plan Note (Signed)
Conduction delay noted.  This does not seem to be new.  Was described in 2018 as well.  We are checking 2D echo just to make sure there is no structural abnormalities to explain it.  With 1 AV block, this could be explained by the beta-blocker.  He has been on a stable dose for a long time.  Would probably not change.  Simply need to monitor for signs of complete block.  No current indication for pacemaker placement.

## 2022-01-10 NOTE — Assessment & Plan Note (Signed)
Lipids have been pretty well controlled, not sure what happened between beginning and end of 2022.  However he seems to be on stable dose of atorvastatin.  Labs being followed by PCP.  Stable.

## 2022-01-10 NOTE — Assessment & Plan Note (Addendum)
Chronic left femoral vein thrombosis.  Not unexpectedly there is evidence of DVT because he never treated had full [place] treatment.  Now status post IVC filter.  86 year old DVT is likely not mobile, probably more laminated.  Likely not an indication for anticoagulation going forward.  Simply continue aspirin 81 mg which is okay to hold 5 days prior to procedures or surgeries.

## 2022-01-10 NOTE — Assessment & Plan Note (Signed)
His pressures seem to be somewhat up-and-down.  Better at home and better at recent clinic visit.  On high doses of amlodipine, carvedilol and irbesartan along with increased dose of HCTZ.  Based on the fact that he does have some unsteady gait with spinal stenosis, and does have documented blood pressure range ranging from the 120s to 150s, I would not want to be more aggressive than this.

## 2022-01-13 ENCOUNTER — Other Ambulatory Visit: Payer: Self-pay

## 2022-01-13 ENCOUNTER — Ambulatory Visit (INDEPENDENT_AMBULATORY_CARE_PROVIDER_SITE_OTHER): Payer: Medicare HMO

## 2022-01-13 DIAGNOSIS — R011 Cardiac murmur, unspecified: Secondary | ICD-10-CM | POA: Diagnosis not present

## 2022-01-13 DIAGNOSIS — I452 Bifascicular block: Secondary | ICD-10-CM

## 2022-01-13 LAB — ECHOCARDIOGRAM COMPLETE
AR max vel: 1.19 cm2
AV Area VTI: 1.13 cm2
AV Area mean vel: 1.25 cm2
AV Mean grad: 17.3 mmHg
AV Peak grad: 30.6 mmHg
AV Vena cont: 0.4 cm
Ao pk vel: 2.77 m/s
Area-P 1/2: 3.15 cm2
Calc EF: 69.4 %
P 1/2 time: 539 msec
S' Lateral: 3.2 cm
Single Plane A2C EF: 69.5 %
Single Plane A4C EF: 68 %

## 2022-01-15 ENCOUNTER — Telehealth: Payer: Self-pay | Admitting: Cardiology

## 2022-01-15 NOTE — Telephone Encounter (Signed)
° ° °  Pt is calling to get echo result °

## 2022-01-15 NOTE — Telephone Encounter (Signed)
Ivan Man, MD  ?01/15/2022  2:04 PM EST   ?  ?Echocardiogram results:. ?  ?Normal left ventricular function with an ejection fraction of 60 to 65% (normal is 50 to 70%.)  Abnormal relaxation which is normal for age associated with moderate left atrial enlargement indicating increased left atrial pressures.  (This could correlate with some shortness of breath on exertion but not significant). ?Normal right ventricle size and function and normal right atrial and ventricular pressures. ?  ?Murmur is likely caused by moderate calcification of the Aortic Valve with Mild to Moderate valve narrowing (aortic stenosis): Mean gradient 17 mmHg. ?  ?  ?Pretty much no significant structural abnormalities.  Mild to moderate aortic stenosis is something that we can follow-up with a repeat echocardiogram in a year or 2.  At this point would not be likely to cause any symptoms. ?  ?  ?Ivan Hew, MD  ? ? ? ? ? ?Patient called w/results. Routed to Dr. [name] [facility]) per patient request ?

## 2022-01-16 ENCOUNTER — Telehealth: Payer: Self-pay

## 2022-01-16 NOTE — Telephone Encounter (Signed)
? ?  Pre-operative Risk Assessment  ?  ?Patient Name: Ivan [name]  ?DOB: 1935-12-17 ?MRN: [id number]  ? ?  ? ?Request for Surgical Clearance   ? ?Procedure:   Lumbar Decompression  ? ?Date of Surgery:  Clearance TBD                              ?   ?Surgeon:  Dr. Susa Day  ?Surgeon's Group or Practice Name:  [facility]  ?Phone number:  [phone] ?Fax number:  [phone] ?  ?Type of Clearance Requested:   ?- Medical  ?- Pharmacy:  Hold Aspirin   ?  ?Type of Anesthesia:  Not Indicated ?  ?Additional requests/questions:   ? ?Signed, ?Jacqulynn Cadet   ?01/16/2022, 4:01 PM  ? ?

## 2022-01-16 NOTE — Telephone Encounter (Signed)
? ?  Primary Cardiologist: [name], MD ? ?Chart reviewed as part of pre-operative protocol coverage. Given past medical history and time since last visit, based on ACC/AHA guidelines, Canio Winokur would be at acceptable risk for the planned procedure without further cardiovascular testing.  ? ?His aspirin may be held for 5 days prior to his procedure.  Please resume as soon as hemostasis is achieved. ? ?I will route this recommendation to the requesting party via Epic fax function and remove from pre-op pool. ? ?Please call with questions. ? ?Jossie Ng. Norma Montemurro NP-C ? ?  ?01/16/2022, 4:35 PM ?[place] ?[address] ?Office [phone] Fax [phone] ? ? ? ? ?

## 2022-01-22 ENCOUNTER — Ambulatory Visit: Payer: Self-pay | Admitting: Orthopedic Surgery

## 2022-01-22 DIAGNOSIS — M48062 Spinal stenosis, lumbar region with neurogenic claudication: Secondary | ICD-10-CM

## 2022-02-02 ENCOUNTER — Ambulatory Visit: Payer: Self-pay | Admitting: Orthopedic Surgery

## 2022-02-02 NOTE — Progress Notes (Signed)
Surgical Instructions ? ? ? Your procedure is scheduled on Friday, March 24th, 2023. ? ? Report to [facility] Main Entrance "A" at 11:00 A.M., then check in with the Admitting office. ? Call this number if you have problems the morning of surgery: ? [phone] ? ? If you have any questions prior to your surgery date call [phone]: Open Monday-Friday 8am-4pm ? ? ? Remember: ? Do not eat after midnight the night before your surgery ? ?You may drink clear liquids until 10:00 the morning of your surgery.   ?Clear liquids allowed are: Water, Non-Citrus Juices (without pulp), Carbonated Beverages, Clear Tea, Black Coffee ONLY (NO MILK, CREAM OR POWDERED CREAMER of any kind), and Gatorade ? ?Patient Instructions ? ?The day of surgery (if you have diabetes): ?Drink ONE (1) 12 oz G2 given to you in your pre admission testing appointment by 10:00 the morning of surgery. Drink in one sitting. Do not sip.  ?This drink was given to you during your hospital  ?pre-op appointment visit.  ?Nothing else to drink after completing the  ?12 oz bottle of G2. ? ?       If you have questions, please contact your surgeon?s office.  ? ?  ? Take these medicines the morning of surgery with A SIP OF WATER:  ? ?amLODipine (NORVASC)  ?atorvastatin (LIPITOR) ?carvedilol (COREG) ?dorzolamide (TRUSOPT) ?timolol (TIMOPTIC) ? ? ?Follow your surgeon's instructions on when to stop Aspirin.  If no instructions were given by your surgeon then you will need to call the office to get those instructions.    ? ?As of today, STOP taking any Aspirin (unless otherwise instructed by your surgeon) Aleve, Naproxen, Ibuprofen, Motrin, Advil, Goody's, BC's, all herbal medications, fish oil, and all vitamins. ? ? ?WHAT DO I DO ABOUT MY DIABETES MEDICATION? ? ?Do not take glimepiride (AMARYL) the night before surgery and the morning of surgery. ? ?HOW TO MANAGE YOUR DIABETES ?BEFORE AND AFTER SURGERY ? ?Why is it important to control my blood sugar before and  after surgery? ?Improving blood sugar levels before and after surgery helps healing and can limit problems. ?A way of improving blood sugar control is eating a healthy diet by: ? Eating less sugar and carbohydrates ? Increasing activity/exercise ? Talking with your doctor about reaching your blood sugar goals ?High blood sugars (greater than 180 mg/dL) can raise your risk of infections and slow your recovery, so you will need to focus on controlling your diabetes during the weeks before surgery. ?Make sure that the doctor who takes care of your diabetes knows about your planned surgery including the date and location. ? ?How do I manage my blood sugar before surgery? ?Check your blood sugar at least 4 times a day, starting 2 days before surgery, to make sure that the level is not too high or low. ? ?Check your blood sugar the morning of your surgery when you wake up and every 2 hours until you get to the Short Stay unit. ? ?If your blood sugar is less than 70 mg/dL, you will need to treat for low blood sugar: ?Do not take insulin. ?Treat a low blood sugar (less than 70 mg/dL) with ? cup of clear juice (cranberry or apple), 4 glucose tablets, OR glucose gel. ?Recheck blood sugar in 15 minutes after treatment (to make sure it is greater than 70 mg/dL). If your blood sugar is not greater than 70 mg/dL on recheck, call [phone] for further instructions. ?Report your blood sugar to the  short stay nurse when you get to Short Stay. ? ?If you are admitted to the hospital after surgery: ?Your blood sugar will be checked by the staff and you will probably be given insulin after surgery (instead of oral diabetes medicines) to make sure you have good blood sugar levels. ?The goal for blood sugar control after surgery is 80-180 mg/dL.  ? ? ? The day of surgery: ?         ?Do not wear jewelry  ?Do not wear lotions, powders, colognes, or deodorant. ?Men may shave face and neck. ?Do not bring valuables to the  hospital. ? ? ?[facility] is not responsible for any belongings or valuables. .  ? ?Do NOT Smoke (Tobacco/Vaping)  24 hours prior to your procedure ? ?If you use a CPAP at night, you may bring your mask for your overnight stay. ?  ?Contacts, glasses, hearing aids, dentures or partials may not be worn into surgery, please bring cases for these belongings ?  ?For patients admitted to the hospital, discharge time will be determined by your treatment team. ?  ?Patients discharged the day of surgery will not be allowed to drive home, and someone needs to stay with them for 24 hours. ? ?NO VISITORS WILL BE ALLOWED IN PRE-OP WHERE PATIENTS ARE PREPPED FOR SURGERY.  ONLY 1 SUPPORT PERSON MAY BE PRESENT IN THE WAITING ROOM WHILE YOU ARE IN SURGERY.  IF YOU ARE TO BE ADMITTED, ONCE YOU ARE IN YOUR ROOM YOU WILL BE ALLOWED TWO (2) VISITORS. 1 (ONE) VISITOR MAY STAY OVERNIGHT BUT MUST ARRIVE TO THE ROOM BY 8pm.  Minor children may have two parents present. Special consideration for safety and communication needs will be reviewed on a case by case basis. ? ?Special instructions:   ? ?Oral Hygiene is also important to reduce your risk of infection.  Remember - BRUSH YOUR TEETH THE MORNING OF SURGERY WITH YOUR REGULAR TOOTHPASTE ? ? ?[facility]- Preparing For Surgery ? ?Before surgery, you can play an important role. Because skin is not sterile, your skin needs to be as free of germs as possible. You can reduce the number of germs on your skin by washing with CHG (chlorahexidine gluconate) Soap before surgery.  CHG is an antiseptic cleaner which kills germs and bonds with the skin to continue killing germs even after washing.   ? ? ?Please do not use if you have an allergy to CHG or antibacterial soaps. If your skin becomes reddened/irritated stop using the CHG.  ?Do not shave (including legs and underarms) for at least 48 hours prior to first CHG shower. It is OK to shave your face. ? ?Please follow these instructions  carefully. ?  ? ? Shower the NIGHT BEFORE SURGERY and the MORNING OF SURGERY with CHG Soap.  ? If you chose to wash your hair, wash your hair first as usual with your normal shampoo. After you shampoo, rinse your hair and body thoroughly to remove the shampoo.  Then [employer] and genitals (private parts) with your normal soap and rinse thoroughly to remove soap. ? ?After that Use CHG Soap as you would any other liquid soap. You can apply CHG directly to the skin and wash gently with a scrungie or a clean washcloth.  ? ?Apply the CHG Soap to your body ONLY FROM THE NECK DOWN.  Do not use on open wounds or open sores. Avoid contact with your eyes, ears, mouth and genitals (private parts). Wash Face and genitals (  private parts)  with your normal soap.  ? ?Wash thoroughly, paying special attention to the area where your surgery will be performed. ? ?Thoroughly rinse your body with warm water from the neck down. ? ?DO NOT shower/wash with your normal soap after using and rinsing off the CHG Soap. ? ?Pat yourself dry with a CLEAN TOWEL. ? ?Wear CLEAN PAJAMAS to bed the night before surgery ? ?Place CLEAN SHEETS on your bed the night before your surgery ? ?DO NOT SLEEP WITH PETS. ? ? ?Day of Surgery: ? ?Take a shower with CHG soap. ?Wear Clean/Comfortable clothing the morning of surgery ?Do not apply any deodorants/lotions.   ?Remember to brush your teeth WITH YOUR REGULAR TOOTHPASTE. ? ? ? ?COVID testing OR BEFORE SURGERY ? ?If you are going to stay overnight or be admitted after your procedure/surgery and require a pre-op COVID test, please follow these instructions after your COVID test  ? ?You are not required to quarantine however you are required to wear a well-fitting mask when you are out and around people not in your household.  If your mask becomes wet or soiled, replace with a new one. ? ?Wash your hands often with soap and water for 20 seconds or clean your hands with an alcohol-based hand sanitizer that  contains at least 60% alcohol. ? ?Do not share personal items. ? ?Notify your provider: ?if you are in close contact with someone who has COVID  ?or if you develop a fever of 100.4 or greater, sneezing, cough, sore throat, shortness of

## 2022-02-02 NOTE — H&P (Signed)
Ivan [name] is an 86 y.o. male.   ?Chief Complaint: back and leg pain ?HPI: Location: low back ?Severity: pain level 7/10 ?Duration: 1 months ?Timing: acute ?Context: cannot identify ?Alleviating Factors: sitting ?Aggravating Factors: standing; walking ?Associated Symptoms: numbness; tingling; instability; radiation down leg ?Previous Surgery: none ?Prior Imaging: x ray; MRI ?Referred by Molli Barrows, PA-C. Weakness in his left leg. Has to use a walker. He is used to being very active ? ?Past Medical History:  ?Diagnosis Date  ? Cancer [facility])   ? Hx pulmonary embolism 2012  ? Left leg DVT-PE (presumably was postop) -> Per patient and wife, he was given 10 days of injections but not placed on DOAC or warfarin; only on aspirin 81 mg  ? Hyperlipidemia   ? Hypertension   ? Lower leg DVT (deep venous thrombosis) ([place]) 2012  ? 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 ? ?Past Surgical History:  ?Procedure Laterality Date  ? IVC FILTER INSERTION N/A 12/26/2021  ? Procedure: IVC FILTER INSERTION;  Surgeon: Cherre Robins, MD;  Location: [place] [facility];  Service: Cardiovascular;  Laterality: N/A;  ? IVC VENOGRAPHY N/A 12/26/2021  ? Procedure: IVC Venography;  Surgeon: Cherre Robins, MD;  Location: [place] [facility];  Service: Cardiovascular;  Laterality: N/A;  ? PROSTATE SURGERY N/A   ? REPLACEMENT TOTAL KNEE Bilateral   ? ? ?Family History  ?Family history unknown: Yes  ? ?Social History:  reports that he has never smoked. He has never used smokeless tobacco. He reports that he does not currently use alcohol. No history on file for drug use. ? ?Allergies:  ?Allergies  ?Allergen Reactions  ? Oxycontin [Oxycodone] Other (See Comments)  ?  hallucinations  ? ? ?Current medications: ?amLODIPine 10 mg tablet ?aspirin 81 mg capsule ?atorvastatin 20 mg tablet ?carvediloL 25 mg tablet ?dorzolamide 2 % eye drops ?glimepiride 4 mg tablet ?hydroCHLOROthiazide 12.5 mg tablet ?latanoprost  (PF) 0.005 % eye drops ?losartan 100 mg tablet ?multivitamin ?Pepcid 20 mg tablet ?Probiotic (B. coagulans) 250 million cell chewable tablet ?timoloL 0.25 % eye drops ?Vitamin C 1,000 mg tablet ?Vitamin D3 125 mcg (5,000 unit) tablet ? ?Review of Systems  ?Constitutional: Negative.   ?HENT: Negative.    ?Eyes: Negative.   ?Respiratory: Negative.    ?Cardiovascular: Negative.   ?Gastrointestinal: Negative.   ?Endocrine: Negative.   ?Genitourinary: Negative.   ?Musculoskeletal:  Positive for back pain and gait problem.  ?Skin: Negative.   ?Neurological:  Positive for weakness and numbness.  ?Psychiatric/Behavioral: Negative.    ? ?There were no vitals taken for this visit. ?Physical Exam ?Constitutional:   ?   Appearance: Normal appearance.  ?HENT:  ?   Head: Normocephalic and atraumatic.  ?   Right Ear: External ear normal.  ?   Left Ear: External ear normal.  ?   Nose: Nose normal.  ?   Mouth/Throat:  ?   Pharynx: Oropharynx is clear.  ?Eyes:  ?   Conjunctiva/sclera: Conjunctivae normal.  ?Cardiovascular:  ?   Rate and Rhythm: Normal rate and regular rhythm.  ?   Pulses: Normal pulses.  ?Pulmonary:  ?   Effort: Pulmonary effort is normal.  ?Abdominal:  ?   General: Bowel sounds are normal.  ?Musculoskeletal:  ?   Cervical back: Normal range of motion.  ?   Comments: Gait and Station: Appearance: ambulating with no assistive devices and antalgic gait. ? ?Constitutional: General Appearance: healthy-appearing and  distress (mild). ? ?Psychiatric: Mood and Affect: active and alert. ? ?Cardiovascular System: Edema Right: none; Dorsalis and posterior tibial pulses 2+. Edema Left: none. ? ?Abdomen: Inspection and Palpation: non-distended and no tenderness. ? ?Skin: Inspection and palpation: no rash. ? ?Lumbar Spine: Inspection: normal alignment. Bony Palpation of the Lumbar Spine: tender at lumbosacral junction.. Bony Palpation of the Right Hip: no tenderness of the greater trochanter and tenderness of the SI joint; Pelvis  stable. Bony Palpation of the Left Hip: no tenderness of the greater trochanter and tenderness of the SI joint. Soft Tissue Palpation on the Right: No flank pain with percussion. Active Range of Motion: limited flexion and extention. ? ?Motor Strength: L1 Motor Strength on the Right: hip flexion iliopsoas 5/5. L1 Motor Strength on the Left: hip flexion iliopsoas 3/5. L2-L4 Motor Strength on the Right: knee extension quadriceps 5/5. L2-L4 Motor Strength on the Left: knee extension quadriceps 3/5. L5 Motor Strength on the Right: ankle dorsiflexion tibialis anterior 5/5 and great toe extension extensor hallucis longus 5/5. L5 Motor Strength on the Left: ankle dorsiflexion tibialis anterior 5/5 and great toe extension extensor hallucis longus 5/5. S1 Motor Strength on the Right: plantar flexion gastrocnemius 5/5. S1 Motor Strength on the Left: plantar flexion gastrocnemius 5/5. ? ?Neurological System: Knee Reflex Right: normal (2). Knee Reflex Left: normal (2). Ankle Reflex Right: normal (2). Ankle Reflex Left: normal (2). Babinski Reflex Right: plantar reflex absent. Babinski Reflex Left: plantar reflex absent. Sensation on the Right: normal distal extremities. Sensation on the Left: normal distal extremities. Special Tests on the Right: no clonus of the ankle/knee. Special Tests on the Left: no clonus of the ankle/knee and seated straight leg raising test positive.  ?Skin: ?   General: Skin is warm and dry.  ?Neurological:  ?   Mental Status: He is alert.  ?  ?Three-view x-rays of the lumbar spine demonstrates multilevel disc degeneration. Mild calcification the aorta. No instability on flexion-extension. 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 ? ?MRI outside indicates severe spinal stenosis at L3-4 moderately severe at L4-5. ? ?Assessment/Plan ?Impression: ? ?1. Neurogenic claudication secondary to spinal stenosis severe at L3-4 moderately severe at L4-5 ?2. Quadricep weakness left 3/5 with associated hip flexor  weakness. ?3. History of DVT and PE remote. Currently on aspirin ? ?Plan: ? ?I discussion concerning his pathology valve anatomy and treatment options. Given the profound deficit in the weakness in his left leg and with his hip flexor with severe stenosis at L3-4 appropriate to consider lumbar decompression at L3-4 and likely at L4-5. ?He is otherwise relatively healthy. He used to be able to walk he is relegated to a walker now ? ?I do not have the details of his DVT PE. I recommend bilateral lower extremity ultrasounds to evaluate for baseline deep venous thrombosis. I also recommend referral to vascular surgeon in town to assess whether a temporary filter would be an option for him preoperatively since we are unable to anticoagulate him for 5 days postop. ? ?I had an extensive discussion with the patient concerning the pathology relevant anatomy and treatment options. At this point exhausting conservative treatment and in the presence of a neurologic deficit we discussed microlumbar decompression. I discussed the risks and benefits including bleeding, infection, DVT, PE, anesthetic complications, worsening in their symptoms, improvement in their symptoms, C SF leakage, epidural fibrosis, need for future surgeries such as revision discectomy and lumbar fusion. I also indicated that this is an operation to basically decompress the nerve roots to  allow recovery as opposed to fixing a herniated disc if it is encountered and that the incidence of recurrent chest disc herniation can approach 15%. Also that nerve root recovery is variable and may not recover completely. Any ligament or bone that is contributing to compressing the nerves will be removed as well. ? ?I discussed the operative course including overnight in the hospital. Immediate ambulation. Follow-up in 2 weeks for suture removal. 6 weeks until healing of the herniation and surgical incision followed by 6 weeks of reconditioning and strengthening of the  core musculature. Also discussed the need to employ the concepts of disc pressure management and core motion following the surgery to minimize the risk of recurrent disc herniation. We will obtain preoperative

## 2022-02-03 ENCOUNTER — Encounter (HOSPITAL_COMMUNITY): Payer: Self-pay | Admitting: Vascular Surgery

## 2022-02-03 ENCOUNTER — Ambulatory Visit (HOSPITAL_COMMUNITY)
Admission: RE | Admit: 2022-02-03 | Discharge: 2022-02-03 | Disposition: A | Discharge status: Home / Self Care | Payer: Medicare HMO | Source: Ambulatory Visit | Attending: Specialist | Admitting: Specialist

## 2022-02-03 ENCOUNTER — Encounter (HOSPITAL_COMMUNITY)
Admission: RE | Admit: 2022-02-03 | Discharge: 2022-02-03 | Disposition: A | Discharge status: Home / Self Care | Payer: Medicare HMO | Source: Ambulatory Visit | Attending: Specialist | Admitting: Specialist

## 2022-02-03 ENCOUNTER — Other Ambulatory Visit: Payer: Self-pay

## 2022-02-03 ENCOUNTER — Encounter (HOSPITAL_COMMUNITY): Payer: Self-pay

## 2022-02-03 VITALS — BP 155/74 | HR 65 | Temp 97.9°F | Resp 17 | Ht 69.0 in | Wt 214.1 lb

## 2022-02-03 DIAGNOSIS — N189 Chronic kidney disease, unspecified: Secondary | ICD-10-CM | POA: Insufficient documentation

## 2022-02-03 DIAGNOSIS — I82512 Chronic embolism and thrombosis of left femoral vein: Secondary | ICD-10-CM | POA: Insufficient documentation

## 2022-02-03 DIAGNOSIS — M48062 Spinal stenosis, lumbar region with neurogenic claudication: Secondary | ICD-10-CM

## 2022-02-03 DIAGNOSIS — Z86711 Personal history of pulmonary embolism: Secondary | ICD-10-CM | POA: Diagnosis not present

## 2022-02-03 DIAGNOSIS — I129 Hypertensive chronic kidney disease with stage 1 through stage 4 chronic kidney disease, or unspecified chronic kidney disease: Secondary | ICD-10-CM | POA: Diagnosis not present

## 2022-02-03 DIAGNOSIS — M5126 Other intervertebral disc displacement, lumbar region: Secondary | ICD-10-CM | POA: Diagnosis present

## 2022-02-03 DIAGNOSIS — I700 Atherosclerosis of aorta: Secondary | ICD-10-CM | POA: Diagnosis not present

## 2022-02-03 DIAGNOSIS — E1122 Type 2 diabetes mellitus with diabetic chronic kidney disease: Secondary | ICD-10-CM | POA: Insufficient documentation

## 2022-02-03 DIAGNOSIS — Z01818 Encounter for other preprocedural examination: Secondary | ICD-10-CM

## 2022-02-03 DIAGNOSIS — E785 Hyperlipidemia, unspecified: Secondary | ICD-10-CM | POA: Insufficient documentation

## 2022-02-03 HISTORY — DX: Cardiac murmur, unspecified: R01.1

## 2022-02-03 HISTORY — DX: Chronic kidney disease, unspecified: N18.9

## 2022-02-03 HISTORY — DX: Type 2 diabetes mellitus without complications: E11.9

## 2022-02-03 LAB — BASIC METABOLIC PANEL
Anion gap: 10 (ref 5–15)
BUN: 24 mg/dL — ABNORMAL HIGH (ref 8–23)
CO2: 26 mmol/L (ref 22–32)
Calcium: 9.2 mg/dL (ref 8.9–10.3)
Chloride: 92 mmol/L — ABNORMAL LOW (ref 98–111)
Creatinine, Ser: 1.46 mg/dL — ABNORMAL HIGH (ref 0.61–1.24)
GFR, Estimated: 47 mL/min — ABNORMAL LOW (ref >60)
Glucose, Bld: 416 mg/dL — ABNORMAL HIGH (ref 70–99)
Potassium: 3.5 mmol/L (ref 3.5–5.1)
Sodium: 128 mmol/L — ABNORMAL LOW (ref 135–145)

## 2022-02-03 LAB — CBC
HCT: 36.3 % — ABNORMAL LOW (ref 39.0–52.0)
Hemoglobin: 13.3 g/dL (ref 13.0–17.0)
MCH: 30.8 pg (ref 26.0–34.0)
MCHC: 36.6 g/dL — ABNORMAL HIGH (ref 30.0–36.0)
MCV: 84 fL (ref 80.0–100.0)
Platelets: 221 10*3/uL (ref 150–400)
RBC: 4.32 MIL/uL (ref 4.22–5.81)
RDW: 12.7 % (ref 11.5–15.5)
WBC: 8.2 10*3/uL (ref 4.0–10.5)
nRBC: 0 % (ref 0.0–0.2)

## 2022-02-03 LAB — GLUCOSE, CAPILLARY
Glucose-Capillary: 433 mg/dL — ABNORMAL HIGH (ref 70–99)
Glucose-Capillary: 403 mg/dL — ABNORMAL HIGH (ref 70–99)

## 2022-02-03 LAB — SURGICAL PCR SCREEN
MRSA, PCR: NEGATIVE
Staphylococcus aureus: NEGATIVE

## 2022-02-03 LAB — HEMOGLOBIN A1C
Hgb A1c MFr Bld: 8.8 % — ABNORMAL HIGH (ref 4.8–5.6)
Mean Plasma Glucose: 205.86 mg/dL

## 2022-02-03 NOTE — Progress Notes (Addendum)
PCP:  Elenor Quinones, MD ?Cardiologist:  [name], MD ?Endocrinologist: denies ? ?EKG: 01/07/22 ?CXR: na ?ECHO:  01/13/22 ?Stress Test: denies ?Cardiac Cath: denies ? ?Fasting Blood Sugar- does not check glucose ?Checks Blood Sugar__0 times a day ?BG 433 and 404 at PAT ?Spoke with Ebony Hail, [place] and Dr. [name] regarding BG ? ?ASA: Last dose 01/30/22 ?Blood Thinner: No ? ?OSA/CPAP: No ? ?Covid test not needed. ? ?Anesthesia Review: Yes, elevated BG.  Cardiac clearance note 01/16/22.  Na+ 128 ? ?Patient denies shortness of breath, fever, cough, and chest pain at PAT appointment. ? ?Patient verbalized understanding of instructions provided today at the PAT appointment.  Patient asked to review instructions at home and day of surgery.   ?

## 2022-02-03 NOTE — Progress Notes (Signed)
Anesthesia Chart Review: ? Case: [id number] Date/Time: 02/06/22 1245  ? Procedure: Microlumbar decompression L3-4, L4-5 - 3 C-Bed  ? Anesthesia type: General  ? 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 Location: [facility] ROOM 04 / [facility]  ? Surgeons: Ivan Day, MD  ? ?  ? ? ?DISCUSSION: Patient is a 86 year male scheduled for the above procedure.  ? 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 S/p IVC placed 12/26/21 due to DVT/PE history with planned back surgery.  ? ?- Preoperative cardiology input outlined on 01/16/22 by Ivan Memos, NP: ?"Chart reviewed as part of pre-operative protocol coverage. Given past medical history and time since last visit, based on ACC/AHA guidelines, Ivan [name] would be at acceptable risk for the planned procedure without further cardiovascular testing.  ?  ?His aspirin may be held for 5 days prior to his procedure.  Please resume as soon as hemostasis is achieved." ? ? ?- Vascular surgery input per Ivan Haring, MD on 12/26/21: ?"Okay to proceed with spinal surgery at any point from my standpoint.  Patient should start pharmacologic DVT prophylaxis as soon as possible after surgery at [name] discretion.  We will make an appointment for IVC filter retrieval in 3 months time." ? ? ?CBG on arrival to 02/03/22 PAT was 433 at 12:53 PM. He reported eating a bagel with cream cheese, 3 apple slices and 4 blueberries for breakfast.  He denied any juice.  He drinks 60 ounces of water daily.  He denied recent steroids.  He takes Amaryl 4 mg BID. He reported feeling "fine". No N/V, polydipsia or polyuria.  He has not had a home glucometer for 4 to 5 years. PAT RN notified Ivan [name] of glucose results--he will await A1c results.  ? ?02/03/22 labs showed glucose 416, BUN 24, Creatinine 1.46, sodium 128 (corrected to 133 for  hyperglycemia by Ivan [name] calculation, 1973). A1c 8.8%, up from 8.0% on 11/20/21.  ? ?Lumbar xray films in process. Will plan to follow-up with Ivan [name] regarding A1c results. Results also routed to his PCP (Ivan [name]) for follow-up purposes.   ? ? ?VS: BP (!) 155/74   Pulse 65   Temp 36.6 ?C (Oral)   Resp 17   Ht '5\' 9"'$  (1.753 m)   Wt 97.1 kg   SpO2 100%   BMI 31.62 kg/m?  ? ? ?PROVIDERS: ?Ivan [name], Ivan Numbers, MD is PCP  ?Ivan Hew, MD is cardiologist. 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  Echo done to evaluate for structural abnormalities and for evaluation of murmur. 01/13/22 echo showed normal LVEF and moderate AS.  Since patient was without active cardiac symptoms, he did not recommend any ischemic evaluation. ? ? ?LABS: See DISCUSSION. Cr 1.46, similar to labs on 12/26/21.  ?(all labs ordered are listed, but only abnormal results are displayed) ? ?Labs Reviewed  ?GLUCOSE, CAPILLARY - Abnormal; Notable for the following components:  ?    Result Value  ? Glucose-Capillary 433 (*)   ? All other components within normal limits  ?BASIC METABOLIC PANEL - Abnormal; Notable for the following components:  ? Sodium 128 (*)   ? Chloride 92 (*)   ? Glucose, Bld 416 (*)   ? BUN 24 (*)   ? Creatinine, Ser 1.46 (*)   ?  GFR, Estimated 47 (*)   ? All other components within normal limits  ?CBC - Abnormal; Notable for the following components:  ? HCT 36.3 (*)   ? MCHC 36.6 (*)   ? All other components within normal limits  ?HEMOGLOBIN A1C - Abnormal; Notable for the following components:  ? Hgb A1c MFr Bld 8.8 (*)   ? All other components within normal limits  ?GLUCOSE, CAPILLARY - Abnormal; Notable for the following components:  ? Glucose-Capillary 403 (*)   ? All other components within normal limits  ?SURGICAL PCR SCREEN  ? ? ? ?IMAGES: ?Xray L-spine 02/03/22: In process. ? ?MRI L-spine 12/04/21 ([facility]): ?IMPRESSION:  ?1.   Lumbar spondylosis most significant at L3-4 with marked facet arthropathy and diffuse disc bulge causing severe central canal stenosis with compression upon the transversing nerve roots.  ?2.  L4-5 moderate central canal stenosis.  ? ? ?EKG: 01/07/22:  ?Sinus rhythm with first-degree AV block ?Right bundle branch block ?Left anterior fascicular block ?Moderate voltage criteria for LVH, may be normal variant ? ? ?CV: ?Echo 01/13/22: ?IMPRESSIONS  ? 1. Left ventricular ejection fraction, by estimation, is 60 to 65%. Left  ?ventricular ejection fraction by 3D volume is 60 %. The left ventricle has  ?normal function. Left ventricular endocardial border not optimally defined  ?to evaluate regional wall  ?motion. Left ventricular diastolic parameters are consistent with Grade I  ?diastolic dysfunction (impaired relaxation). The average left ventricular  ?global longitudinal strain is -12.4 %. The global longitudinal strain is  ?abnormal.  ? 2. Right ventricular systolic function is normal. The right ventricular  ?size is normal. Tricuspid regurgitation signal is inadequate for assessing  ?PA pressure.  ? 3. Left atrial size was moderately dilated.  ? 4. The mitral valve is normal in structure. Trivial mitral valve  ?regurgitation.  ? 5. The aortic valve is tricuspid. There is moderate calcification of the  ?aortic valve. There is moderate thickening of the aortic valve. Aortic  ?valve regurgitation is mild. Moderate aortic valve stenosis. Aortic valve mean gradient measures 17.3 mmHg. Aortic valve  ?peak gradient measures 30.6 mmHg. Aortic valve area, by VTI measures 1.13  ?cm?.  ? 6. The inferior vena cava is normal in size with greater than 50%  ?respiratory variability, suggesting right atrial pressure of 3 mmHg.  ? ? ?Past Medical History:  ?Diagnosis Date  ? Cancer [facility])   ? Chronic kidney disease   ? Diabetes mellitus without complication ([place])   ? Heart murmur   ? moderate AS 01/13/22  ? Hx pulmonary embolism 2012  ?  Left leg DVT-PE (presumably was postop) -> Per patient and wife, he was given 10 days of injections but not placed on DOAC or warfarin; only on aspirin 81 mg  ? Hyperlipidemia   ? Hypertension   ? Lower leg DVT (deep venous thrombosis) ([place]) 2012  ? 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 ? ?Past Surgical History:  ?Procedure Laterality Date  ? IVC FILTER INSERTION N/A 12/26/2021  ? Procedure: IVC FILTER INSERTION;  Surgeon: Cherre Robins, MD;  Location: [place] [facility];  Service: Cardiovascular;  Laterality: N/A;  ? IVC VENOGRAPHY N/A 12/26/2021  ? Procedure: IVC Venography;  Surgeon: Cherre Robins, MD;  Location: [place] [facility];  Service: Cardiovascular;  Laterality: N/A;  ? PROSTATE SURGERY N/A   ? REPLACEMENT TOTAL KNEE Bilateral   ? ? ?MEDICATIONS: ? amLODipine (NORVASC) 10  MG tablet  ? Ascorbic Acid (VITAMIN C) 1000 MG tablet  ? aspirin 81 MG EC tablet  ? atorvastatin (LIPITOR) 20 MG tablet  ? carvedilol (COREG) 25 MG tablet  ? Cholecalciferol (VITAMIN D3 PO)  ? dorzolamide (TRUSOPT) 2 % ophthalmic solution  ? famotidine (PEPCID) 20 MG tablet  ? glimepiride (AMARYL) 4 MG tablet  ? hydrochlorothiazide (HYDRODIURIL) 25 MG tablet  ? irbesartan (AVAPRO) 300 MG tablet  ? latanoprost (XALATAN) 0.005 % ophthalmic solution  ? Misc Natural Products (BLOOD SUGAR BALANCE PO)  ? Multiple Vitamin (MULTIVITAMIN WITH MINERALS) TABS tablet  ? Probiotic Product (PROBIOTIC PO)  ? timolol (TIMOPTIC) 0.5 % ophthalmic solution  ? ?No current facility-administered medications for this encounter.  ? ? ?Myra Gianotti, PA-C ?Surgical Short Stay/Anesthesiology ?[facility] Phone [phone] ?[facility] Phone [phone] ?02/03/2022 6:47 PM ? ? ? ? ? ? ?

## 2022-02-03 NOTE — Anesthesia Preprocedure Evaluation (Deleted)
Anesthesia Evaluation  ? ? ?Airway ? ? ? ? ? ? ? Dental ?  ?Pulmonary ? ?  ? ? ? ? ? ? ? Cardiovascular ?hypertension,  ? ?Echo 01/13/22: ?IMPRESSIONS  ??1. Left ventricular ejection fraction, by estimation, is 60 to 65%. Left  ?ventricular ejection fraction by 3D volume is 60 %. The left ventricle has  ?normal function. Left ventricular endocardial border not optimally defined  ?to evaluate regional wall  ?motion. Left ventricular diastolic parameters are consistent with Grade I  ?diastolic dysfunction (impaired relaxation). The average left ventricular  ?global longitudinal strain is -12.4 %. The global longitudinal strain is  ?abnormal.  ??2. Right ventricular systolic function is normal. The right ventricular  ?size is normal. Tricuspid regurgitation signal is inadequate for assessing  ?PA pressure.  ??3. Left atrial size was moderately dilated.  ??4. The mitral valve is normal in structure. Trivial mitral valve  ?regurgitation.  ??5. The aortic valve is tricuspid. There is moderate calcification of the  ?aortic valve. There is moderate thickening of the aortic valve. Aortic  ?valve regurgitation is mild. Moderate aortic valve stenosis. Aortic valve mean gradient measures 17.3 mmHg. Aortic valve  ?peak gradient measures 30.6 mmHg. Aortic valve area, by VTI measures 1.13  ?cm?.  ??6. The inferior vena cava is normal in size with greater than 50%  ?respiratory variability, suggesting right atrial pressure of 3 mmHg.  ? ?  ?Neuro/Psych ?  ? GI/Hepatic ?  ?Endo/Other  ?diabetes ? Renal/GU ?  ? ?  ?Musculoskeletal ? ? Abdominal ?  ?Peds ? Hematology ?  ?Anesthesia Other Findings ? ? Reproductive/Obstetrics ? ?  ? ? ? ? ? ? ? ? ? ? ? ? ? ?  ?  ? ? ? ? ? ? ? ? ?Anesthesia Physical ?Anesthesia Plan ? ?ASA:  ? ?Anesthesia Plan:   ? ?Post-op Pain Management:   ? ?Induction:  ? ?PONV Risk Score and Plan:  ? ?Airway Management Planned:  ? ?Additional Equipment:  ? ?Intra-op Plan:   ? ?Post-operative Plan:  ? ?Informed Consent:  ? ?Plan Discussed with:  ? ?Anesthesia Plan Comments: (PAT note written 02/03/2022 by Myra Gianotti, PA-C. ?)  ? ? ? ? ? ? ?Anesthesia Quick Evaluation ? ?

## 2022-02-06 ENCOUNTER — Ambulatory Visit (HOSPITAL_COMMUNITY): Admission: RE | Admit: 2022-02-06 | Payer: Medicare HMO | Source: Home / Self Care | Admitting: Specialist

## 2022-02-06 SURGERY — LUMBAR LAMINECTOMY/DECOMPRESSION MICRODISCECTOMY 2 LEVELS
Anesthesia: General

## 2022-02-10 ENCOUNTER — Ambulatory Visit: Payer: Medicare HMO | Admitting: Cardiology

## 2022-02-27 ENCOUNTER — Telehealth: Payer: Self-pay

## 2022-02-27 NOTE — Telephone Encounter (Signed)
Contacted patient regarding scheduling IVC filter removal. Patient reports his spine surgery was postponed until his blood sugar was better controlled. States he will follow up with our office towards middle of May because he should be rescheduling surgery soon.  ?

## 2022-03-23 ENCOUNTER — Ambulatory Visit: Payer: Self-pay | Admitting: Orthopedic Surgery

## 2022-03-23 DIAGNOSIS — M48062 Spinal stenosis, lumbar region with neurogenic claudication: Secondary | ICD-10-CM

## 2022-04-08 NOTE — Pre-Procedure Instructions (Signed)
Surgical Instructions    Your procedure is scheduled on Friday, June 2.  Report to [facility] Main Entrance "A" at 10:30 A.M., then check in with the Admitting office.  Call this number if you have problems the morning of surgery:  [phone]   If you have any questions prior to your surgery date call [phone]: Open Monday-Friday 8am-4pm    Remember:  Do not eat after midnight the night before your surgery  You may drink clear liquids until 9:30AM the morning of your surgery.   Clear liquids allowed are: Water, Non-Citrus Juices (without pulp), Carbonated Beverages, Clear Tea, Black Coffee ONLY (NO MILK, CREAM OR POWDERED CREAMER of any kind), and Gatorade   Patient Instructions  The night before surgery:  No food after midnight. ONLY clear liquids after midnight  The day of surgery (if you have diabetes): Drink ONE (1) 12 oz G2 given to you in your pre admission testing appointment by 9:30AM the morning of surgery. Drink in one sitting. Do not sip.  This drink was given to you during your hospital  pre-op appointment visit.  Nothing else to drink after completing the  12 oz bottle of G2.         If you have questions, please contact your surgeon's office.   Take these medicines the morning of surgery with A SIP OF WATER:   amLODipine (NORVASC)  carvedilol (COREG)  dorzolamide (TRUSOPT)  eye drops timolol (TIMOPTIC)  eye drops  Follow your surgeon's instructions on when to stop Aspirin.  If no instructions were given by your surgeon then you will need to call the office to get those instructions.     As of today, STOP taking any Aleve, Naproxen, Ibuprofen, Motrin, Advil, Goody's, BC's, all herbal medications, fish oil, and all vitamins.  WHAT DO I DO ABOUT MY DIABETES MEDICATION?   Do not take oral diabetes medicines (pills) the morning of surgery.   DO NOT take glimepiride (Amaryl), Jardiance, or Rybelsus the day of surgery (04/17/22).   The Day before surgery  (04/16/22)- Only take MORNING dose of glimepiride (Amaryl). DO NOT take evening dose of glimepiride (Amaryl).  DO NOT take Jardiance the day before surgery (04/16/22)    HOW TO MANAGE YOUR DIABETES BEFORE AND AFTER SURGERY  Why is it important to control my blood sugar before and after surgery? Improving blood sugar levels before and after surgery helps healing and can limit problems. A way of improving blood sugar control is eating a healthy diet by:  Eating less sugar and carbohydrates  Increasing activity/exercise  Talking with your doctor about reaching your blood sugar goals High blood sugars (greater than 180 mg/dL) can raise your risk of infections and slow your recovery, so you will need to focus on controlling your diabetes during the weeks before surgery. Make sure that the doctor who takes care of your diabetes knows about your planned surgery including the date and location.  How do I manage my blood sugar before surgery? Check your blood sugar at least 4 times a day, starting 2 days before surgery, to make sure that the level is not too high or low.  Check your blood sugar the morning of your surgery when you wake up and every 2 hours until you get to the Short Stay unit.  If your blood sugar is less than 70 mg/dL, you will need to treat for low blood sugar: Do not take insulin. Treat a low blood sugar (less than 70 mg/dL) with  cup of clear juice (cranberry or apple), 4 glucose tablets, OR glucose gel. Recheck blood sugar in 15 minutes after treatment (to make sure it is greater than 70 mg/dL). If your blood sugar is not greater than 70 mg/dL on recheck, call [phone] for further instructions. Report your blood sugar to the short stay nurse when you get to Short Stay.  If you are admitted to the hospital after surgery: Your blood sugar will be checked by the staff and you will probably be given insulin after surgery (instead of oral diabetes medicines) to make sure you  have good blood sugar levels. The goal for blood sugar control after surgery is 80-180 mg/dL.            Do not wear jewelry or makeup Do not wear lotions, powders, perfumes/colognes, or deodorant. Do not shave 48 hours prior to surgery.  Men may shave face and neck. Do not bring valuables to the hospital. Do not wear nail polish, gel polish, artificial nails, or any other type of covering on natural nails (fingers and toes) If you have artificial nails or gel coating that need to be removed by a nail salon, please have this removed prior to surgery. Artificial nails or gel coating may interfere with anesthesia's ability to adequately monitor your vital signs.  [facility] is not responsible for any belongings or valuables. .   Do NOT Smoke (Tobacco/Vaping)  24 hours prior to your procedure  If you use a CPAP at night, you may bring your mask for your overnight stay.   Contacts, glasses, hearing aids, dentures or partials may not be worn into surgery, please bring cases for these belongings   For patients admitted to the hospital, discharge time will be determined by your treatment team.   Patients discharged the day of surgery will not be allowed to drive home, and someone needs to stay with them for 24 hours.   SURGICAL WAITING ROOM VISITATION Patients having surgery or a procedure in a hospital may have two support people. Children under the age of 20 must have an adult with them who is not the patient. They may stay in the waiting area during the procedure and may switch out with other visitors. If the patient needs to stay at the hospital during part of their recovery, the visitor guidelines for inpatient rooms apply.  Please refer to the [facility] website for the visitor guidelines for Inpatients (after your surgery is over and you are in a regular room).       Special instructions:    Oral Hygiene is also important to reduce your risk of infection.  Remember - BRUSH YOUR  TEETH THE MORNING OF SURGERY WITH YOUR REGULAR TOOTHPASTE   [facility]- Preparing For Surgery  Before surgery, you can play an important role. Because skin is not sterile, your skin needs to be as free of germs as possible. You can reduce the number of germs on your skin by washing with CHG (chlorahexidine gluconate) Soap before surgery.  CHG is an antiseptic cleaner which kills germs and bonds with the skin to continue killing germs even after washing.     Please do not use if you have an allergy to CHG or antibacterial soaps. If your skin becomes reddened/irritated stop using the CHG.  Do not shave (including legs and underarms) for at least 48 hours prior to first CHG shower. It is OK to shave your face.  Please follow these instructions carefully.  Shower the NIGHT BEFORE SURGERY and the MORNING OF SURGERY with CHG Soap.   If you chose to wash your hair, wash your hair first as usual with your normal shampoo. After you shampoo, rinse your hair and body thoroughly to remove the shampoo.  Then [employer] and genitals (private parts) with your normal soap and rinse thoroughly to remove soap.  After that Use CHG Soap as you would any other liquid soap. You can apply CHG directly to the skin and wash gently with a scrungie or a clean washcloth.   Apply the CHG Soap to your body ONLY FROM THE NECK DOWN.  Do not use on open wounds or open sores. Avoid contact with your eyes, ears, mouth and genitals (private parts). Wash Face and genitals (private parts)  with your normal soap.   Wash thoroughly, paying special attention to the area where your surgery will be performed.  Thoroughly rinse your body with warm water from the neck down.  DO NOT shower/wash with your normal soap after using and rinsing off the CHG Soap.  Pat yourself dry with a CLEAN TOWEL.  Wear CLEAN PAJAMAS to bed the night before surgery  Place CLEAN SHEETS on your bed the night before your surgery  DO NOT SLEEP WITH  PETS.   Day of Surgery:  Take a shower with CHG soap. Wear Clean/Comfortable clothing the morning of surgery Do not apply any deodorants/lotions.   Remember to brush your teeth WITH YOUR REGULAR TOOTHPASTE.    If you received a COVID test during your pre-op visit, it is requested that you wear a mask when out in public, stay away from anyone that may not be feeling well, and notify your surgeon if you develop symptoms. If you have been in contact with anyone that has tested positive in the last 10 days, please notify your surgeon.    Please read over the following fact sheets that you were given.

## 2022-04-09 ENCOUNTER — Ambulatory Visit (HOSPITAL_COMMUNITY)
Admission: RE | Admit: 2022-04-09 | Discharge: 2022-04-09 | Disposition: A | Discharge status: Home / Self Care | Payer: Medicare HMO | Source: Ambulatory Visit | Attending: Orthopedic Surgery | Admitting: Orthopedic Surgery

## 2022-04-09 ENCOUNTER — Encounter (HOSPITAL_COMMUNITY): Payer: Self-pay

## 2022-04-09 ENCOUNTER — Other Ambulatory Visit: Payer: Self-pay

## 2022-04-09 ENCOUNTER — Encounter (HOSPITAL_COMMUNITY)
Admission: RE | Admit: 2022-04-09 | Discharge: 2022-04-09 | Disposition: A | Discharge status: Home / Self Care | Payer: Medicare HMO | Source: Ambulatory Visit | Attending: Specialist | Admitting: Specialist

## 2022-04-09 ENCOUNTER — Other Ambulatory Visit (HOSPITAL_COMMUNITY): Payer: Medicare HMO

## 2022-04-09 VITALS — BP 156/54 | HR 50 | Temp 97.7°F | Resp 17 | Ht 69.0 in | Wt 203.6 lb

## 2022-04-09 DIAGNOSIS — Z01818 Encounter for other preprocedural examination: Secondary | ICD-10-CM

## 2022-04-09 DIAGNOSIS — E785 Hyperlipidemia, unspecified: Secondary | ICD-10-CM | POA: Insufficient documentation

## 2022-04-09 DIAGNOSIS — Z7984 Long term (current) use of oral hypoglycemic drugs: Secondary | ICD-10-CM | POA: Insufficient documentation

## 2022-04-09 DIAGNOSIS — I10 Essential (primary) hypertension: Secondary | ICD-10-CM | POA: Diagnosis not present

## 2022-04-09 DIAGNOSIS — Z86718 Personal history of other venous thrombosis and embolism: Secondary | ICD-10-CM | POA: Insufficient documentation

## 2022-04-09 DIAGNOSIS — E119 Type 2 diabetes mellitus without complications: Secondary | ICD-10-CM | POA: Insufficient documentation

## 2022-04-09 DIAGNOSIS — M48062 Spinal stenosis, lumbar region with neurogenic claudication: Secondary | ICD-10-CM | POA: Insufficient documentation

## 2022-04-09 DIAGNOSIS — N189 Chronic kidney disease, unspecified: Secondary | ICD-10-CM | POA: Diagnosis not present

## 2022-04-09 LAB — CBC
HCT: 41.3 % (ref 39.0–52.0)
Hemoglobin: 15.4 g/dL (ref 13.0–17.0)
MCH: 30.6 pg (ref 26.0–34.0)
MCHC: 37.3 g/dL — ABNORMAL HIGH (ref 30.0–36.0)
MCV: 81.9 fL (ref 80.0–100.0)
Platelets: 216 10*3/uL (ref 150–400)
RBC: 5.04 MIL/uL (ref 4.22–5.81)
RDW: 12.9 % (ref 11.5–15.5)
WBC: 8.6 10*3/uL (ref 4.0–10.5)
nRBC: 0 % (ref 0.0–0.2)

## 2022-04-09 LAB — GLUCOSE, CAPILLARY: Glucose-Capillary: 180 mg/dL — ABNORMAL HIGH (ref 70–99)

## 2022-04-09 LAB — SURGICAL PCR SCREEN
MRSA, PCR: NEGATIVE
Staphylococcus aureus: NEGATIVE

## 2022-04-09 NOTE — Pre-Procedure Instructions (Signed)
Surgical Instructions    Your procedure is scheduled on Friday, June 2.  Report to [facility] Main Entrance "A" at 10:30 A.M., then check in with the Admitting office.  Call this number if you have problems the morning of surgery:  [phone]   If you have any questions prior to your surgery date call [phone]: Open Monday-Friday 8am-4pm    Remember:  Do not eat after midnight the night before your surgery  You may drink clear liquids until 9:30AM the morning of your surgery.   Clear liquids allowed are: Water, Non-Citrus Juices (without pulp), Carbonated Beverages, Clear Tea, Black Coffee ONLY (NO MILK, CREAM OR POWDERED CREAMER of any kind), and Gatorade   Patient Instructions  The day of surgery (if you have diabetes): Drink ONE (1) 12 oz G2 given to you in your pre admission testing appointment by 9:30AM the morning of surgery. Drink in one sitting. Do not sip.  This drink was given to you during your hospital  pre-op appointment visit.  Nothing else to drink after completing the  12 oz bottle of G2.         If you have questions, please contact your surgeon's office.   Take these medicines the morning of surgery with A SIP OF WATER:   amLODipine (NORVASC)  carvedilol (COREG)  dorzolamide (TRUSOPT)  eye drops timolol (TIMOPTIC)  eye drops  Follow your surgeon's instructions on when to stop Aspirin.  If no instructions were given by your surgeon then you will need to call the office to get those instructions.     As of today, STOP taking any Aleve, Naproxen, Ibuprofen, Motrin, Advil, Goody's, BC's, all herbal medications, fish oil, and all vitamins.  WHAT DO I DO ABOUT MY DIABETES MEDICATION?   Do not take oral diabetes medicines (pills) the morning of surgery.   DO NOT take glimepiride (Amaryl), Jardiance, or Rybelsus the day of surgery (04/17/22).   The Day before surgery (04/16/22)- Only take MORNING dose of glimepiride (Amaryl). DO NOT take evening dose of  glimepiride (Amaryl).  DO NOT take Jardiance the day before surgery (04/16/22)    HOW TO MANAGE YOUR DIABETES BEFORE AND AFTER SURGERY  Why is it important to control my blood sugar before and after surgery? Improving blood sugar levels before and after surgery helps healing and can limit problems. A way of improving blood sugar control is eating a healthy diet by:  Eating less sugar and carbohydrates  Increasing activity/exercise  Talking with your doctor about reaching your blood sugar goals High blood sugars (greater than 180 mg/dL) can raise your risk of infections and slow your recovery, so you will need to focus on controlling your diabetes during the weeks before surgery. Make sure that the doctor who takes care of your diabetes knows about your planned surgery including the date and location.  How do I manage my blood sugar before surgery? Check your blood sugar at least 4 times a day, starting 2 days before surgery, to make sure that the level is not too high or low.  Check your blood sugar the morning of your surgery when you wake up and every 2 hours until you get to the Short Stay unit.  If your blood sugar is less than 70 mg/dL, you will need to treat for low blood sugar: Do not take insulin. Treat a low blood sugar (less than 70 mg/dL) with  cup of clear juice (cranberry or apple), 4 glucose tablets, OR glucose gel. Recheck  blood sugar in 15 minutes after treatment (to make sure it is greater than 70 mg/dL). If your blood sugar is not greater than 70 mg/dL on recheck, call [phone] for further instructions. Report your blood sugar to the short stay nurse when you get to Short Stay.  If you are admitted to the hospital after surgery: Your blood sugar will be checked by the staff and you will probably be given insulin after surgery (instead of oral diabetes medicines) to make sure you have good blood sugar levels. The goal for blood sugar control after surgery is 80-180  mg/dL.            DAY OF SURGERY: Do not wear jewelry or makeup Do not wear lotions, powders, colognes, or deodorant. Men may shave face and neck. Do not bring valuables to the hospital. Do not wear nail polish, gel polish, artificial nails, or any other type of covering on natural nails (fingers and toes) If you have artificial nails or gel coating that need to be removed by a nail salon, please have this removed prior to surgery. Artificial nails or gel coating may interfere with anesthesia's ability to adequately monitor your vital signs.  [facility] is not responsible for any belongings or valuables. .   Do NOT Smoke (Tobacco/Vaping)  24 hours prior to your procedure  If you use a CPAP at night, you may bring your mask for your overnight stay.   Contacts, glasses, hearing aids, dentures or partials may not be worn into surgery, please bring cases for these belongings   For patients admitted to the hospital, discharge time will be determined by your treatment team.   Patients discharged the day of surgery will not be allowed to drive home, and someone needs to stay with them for 24 hours.   SURGICAL WAITING ROOM VISITATION Patients having surgery or a procedure in a hospital may have two support people. Children under the age of 28 must have an adult with them who is not the patient. They may stay in the waiting area during the procedure and may switch out with other visitors. If the patient needs to stay at the hospital during part of their recovery, the visitor guidelines for inpatient rooms apply.  Please refer to the [facility] website for the visitor guidelines for Inpatients (after your surgery is over and you are in a regular room).    Special instructions:    Oral Hygiene is also important to reduce your risk of infection.  Remember - BRUSH YOUR TEETH THE MORNING OF SURGERY WITH YOUR REGULAR TOOTHPASTE   - Preparing For Surgery  Before surgery, you can  play an important role. Because skin is not sterile, your skin needs to be as free of germs as possible. You can reduce the number of germs on your skin by washing with CHG (chlorahexidine gluconate) Soap before surgery.  CHG is an antiseptic cleaner which kills germs and bonds with the skin to continue killing germs even after washing.     Please do not use if you have an allergy to CHG or antibacterial soaps. If your skin becomes reddened/irritated stop using the CHG.  Do not shave (including legs and underarms) for at least 48 hours prior to first CHG shower. It is OK to shave your face.  Please follow these instructions carefully.     Shower the NIGHT BEFORE SURGERY and the MORNING OF SURGERY with CHG Soap.   If you chose to wash your hair, wash  your hair first as usual with your normal shampoo. After you shampoo, rinse your hair and body thoroughly to remove the shampoo.  Then [employer] and genitals (private parts) with your normal soap and rinse thoroughly to remove soap.  After that Use CHG Soap as you would any other liquid soap. You can apply CHG directly to the skin and wash gently with a scrungie or a clean washcloth.   Apply the CHG Soap to your body ONLY FROM THE NECK DOWN.  Do not use on open wounds or open sores. Avoid contact with your eyes, ears, mouth and genitals (private parts). Wash Face and genitals (private parts)  with your normal soap.   Wash thoroughly, paying special attention to the area where your surgery will be performed.  Thoroughly rinse your body with warm water from the neck down.  DO NOT shower/wash with your normal soap after using and rinsing off the CHG Soap.  Pat yourself dry with a CLEAN TOWEL.  Wear CLEAN PAJAMAS to bed the night before surgery  Place CLEAN SHEETS on your bed the night before your surgery  DO NOT SLEEP WITH PETS.   Day of Surgery:  Take a shower with CHG soap. Wear Clean/Comfortable clothing the morning of surgery Do not  apply any deodorants/lotions.   Remember to brush your teeth WITH YOUR REGULAR TOOTHPASTE.    Please read over the following fact sheets that you were given.

## 2022-04-09 NOTE — Progress Notes (Signed)
PCP - Elenor Quinones Cardiologist - Dr. Ellyn Hack   Chest x-ray - n/a EKG - 01/07/22 Stress Test - 08/04/17 ECHO - 01/13/22  DM - type 2 Fasting Blood Sugar - 100/125  Aspirin Instructions: hold 5 days prior to surgery, per cardiology (from previous scheduled sx)  ERAS Protcol - yes, G2 given   Anesthesia review: yes, see Allison's note from 02/03/22 (pt previously cancelled due to blood sugar)  Patient denies shortness of breath, fever, cough and chest pain at PAT appointment   All instructions explained to the patient, with a verbal understanding of the material. Patient agrees to go over the instructions while at home for a better understanding. Patient also instructed to self quarantine after being tested for COVID-19. The opportunity to ask questions was provided.

## 2022-04-10 ENCOUNTER — Ambulatory Visit: Payer: Self-pay | Admitting: Orthopedic Surgery

## 2022-04-10 NOTE — H&P (Signed)
Ivan [name] is an 86 y.o. male.   Chief Complaint: back and leg pain HPI: Location: low back Severity: pain level 7/10 Duration: 1 months Timing: acute Context: cannot identify Alleviating Factors: sitting Aggravating Factors: standing; walking Associated Symptoms: numbness; tingling; instability; radiation down leg Previous Surgery: none Prior Imaging: x ray; MRI Referred by Molli Barrows, PA-C. Weakness in his left leg. Has to use a walker. 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 He has never used smokeless tobacco. He reports that he does not currently use alcohol. He reports that he does not use drugs.  Allergies:  Allergies  Allergen Reactions   Oxycontin [Oxycodone] Other (See Comments)    hallucinations    Current meds: amLODIPine 10 mg tablet aspirin 81 mg capsule atorvastatin 20 mg tablet carvediloL  25 mg tablet dorzolamide 2 % eye drops glimepiride 4 mg tablet hydroCHLOROthiazide 12.5 mg tablet latanoprost (PF) 0.005 % eye drops losartan 100 mg tablet multivitamin Pepcid 20 mg tablet Probiotic (B. coagulans) 250 million cell chewable tablet timoloL 0.25 % eye drops Vitamin C 1,000 mg tablet Vitamin D3 125 mcg (5,000 unit) tablet   Results for orders placed or performed during the hospital encounter of 04/09/22 (from the past 48 hour(s))  Glucose, capillary     Status: Abnormal   Collection Time: 04/09/22  9:13 AM  Result Value Ref Range   Glucose-Capillary 180 (H) 70 - 99 mg/dL    Comment: Glucose reference range applies only to samples taken after fasting for at least 8 hours.  CBC per protocol     Status: Abnormal   Collection Time: 04/09/22  9:53 AM  Result Value Ref Range   WBC 8.6 4.0 - 10.5 K/uL   RBC 5.04 4.22 - 5.81 MIL/uL   Hemoglobin 15.4 13.0 - 17.0 g/dL   HCT 41.3 39.0 - 52.0 %   MCV 81.9 80.0 - 100.0 fL   MCH 30.6 26.0 - 34.0 pg   MCHC 37.3 (H) 30.0 - 36.0 g/dL   RDW 12.9 11.5 - 15.5 %   Platelets 216 150 - 400 K/uL   nRBC 0.0 0.0 - 0.2 %    Comment: Performed at [facility], [place] [address] [place], [place] [address]  Surgical pcr screen  Status: None   Collection Time: 04/09/22 11:44 AM   Specimen: Nasal Mucosa; Nasal Swab  Result Value Ref Range   MRSA, PCR NEGATIVE NEGATIVE   Staphylococcus aureus NEGATIVE NEGATIVE    Comment: (NOTE) The Xpert SA Assay (FDA approved for NASAL specimens in patients 45 years of age and older), is one component of a comprehensive surveillance program. It is not intended to diagnose infection nor to guide or monitor treatment. Performed at [facility], [place] [address], [place], [place] [address]    DG Lumbar Spine 2-3 Views  Result Date: 04/10/2022 CLINICAL DATA:  Preoperative imaging. EXAM: LUMBAR SPINE - 2-3 VIEW COMPARISON:  02/03/2022. FINDINGS: There is no evidence of lumbar spine fracture.  Alignment is normal. Intervertebral disc space narrowing and degenerative endplate changes are present at multiple levels. An IVC filter is noted. There is atherosclerotic calcification of the aorta. There are moderate degenerative changes at the right hip. IMPRESSION: Multilevel degenerative changes in the lumbar spine. Electronically Signed   By: Brett Fairy M.D.   On: 04/10/2022 02:36    Review of Systems  Constitutional: Negative.   HENT: Negative.    Eyes: Negative.   Respiratory: Negative.    Cardiovascular: Negative.   Gastrointestinal: Negative.   Endocrine: Negative.   Genitourinary: Negative.   Musculoskeletal:  Positive for back pain and gait problem.  Skin: Negative.   Neurological:  Positive for weakness and numbness.  Psychiatric/Behavioral: Negative.     There were no vitals taken for this visit. Physical Exam Constitutional:      Appearance: Normal appearance.  HENT:     Head: Normocephalic and atraumatic.     Right Ear: External ear normal.     Left Ear: External ear normal.     Nose: Nose normal.     Mouth/Throat:     Pharynx: Oropharynx is clear.  Eyes:     Conjunctiva/sclera: Conjunctivae normal.  Cardiovascular:     Rate and Rhythm: Normal rate and regular rhythm.     Pulses: Normal pulses.     Heart sounds: Normal heart sounds.  Pulmonary:     Effort: Pulmonary effort is normal.     Breath sounds: Normal breath sounds.  Abdominal:     General: Bowel sounds are normal.  Musculoskeletal:     Cervical back: Normal range of motion.     Comments: Gait and Station: Appearance: ambulating with no assistive devices and antalgic gait.  Constitutional: General Appearance: healthy-appearing and distress (mild).  Psychiatric: Mood and Affect: active and alert.  Cardiovascular System: Edema Right: none; Dorsalis and posterior tibial pulses 2+. Edema Left: none.  Abdomen: Inspection and Palpation: non-distended and no tenderness.  Skin: Inspection and  palpation: no rash.  Lumbar Spine: Inspection: normal alignment. Bony Palpation of the Lumbar Spine: tender at lumbosacral junction.. Bony Palpation of the Right Hip: no tenderness of the greater trochanter and tenderness of the SI joint; Pelvis stable. Bony Palpation of the Left Hip: no tenderness of the greater trochanter and tenderness of the SI joint. Soft Tissue Palpation on the Right: No flank pain with percussion. Active Range of Motion: limited flexion and extention.  Motor Strength: L1 Motor Strength on the Right: hip flexion iliopsoas 5/5. L1 Motor Strength on the Left: hip flexion iliopsoas 3/5. L2-L4 Motor Strength on the Right: knee extension quadriceps 5/5. L2-L4 Motor Strength on the Left: knee extension quadriceps 3/5. L5 Motor Strength on the Right: ankle dorsiflexion tibialis anterior 5/5 and great toe extension extensor hallucis  longus 5/5. L5 Motor Strength on the Left: ankle dorsiflexion tibialis anterior 5/5 and great toe extension extensor hallucis longus 5/5. S1 Motor Strength on the Right: plantar flexion gastrocnemius 5/5. S1 Motor Strength on the Left: plantar flexion gastrocnemius 5/5.  Neurological System: Knee Reflex Right: normal (2). Knee Reflex Left: normal (2). Ankle Reflex Right: normal (2). Ankle Reflex Left: normal (2). Babinski Reflex Right: plantar reflex absent. Babinski Reflex Left: plantar reflex absent. Sensation on the Right: normal distal extremities. Sensation on the Left: normal distal extremities. Special Tests on the Right: no clonus of the ankle/knee. Special Tests on the Left: no clonus of the ankle/knee and seated straight leg raising test positive.  Neurological:     Mental Status: He is alert.    Three-view x-rays of the lumbar spine demonstrates multilevel disc degeneration. Mild calcification the aorta. No instability on flexion-extension. 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  MRI outside indicates severe spinal stenosis at L3-4 moderately severe  at L4-5.  Assessment/Plan Impression:  1. 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 Quadricep weakness left 3/5 with associated hip flexor weakness. 3. History of DVT and PE remote. Currently on aspirin  Plan:  I discussion concerning his pathology valve anatomy and treatment options. Given the profound deficit in the weakness in his left leg and with his hip flexor with severe stenosis at L3-4 appropriate to consider lumbar decompression at L3-4 and likely at L4-5. He is otherwise relatively healthy. He used to be able to walk he is relegated to a walker now  I do not have the details of his DVT PE. I recommend bilateral lower extremity ultrasounds to evaluate for baseline deep venous thrombosis. I also recommend referral to vascular surgeon in town to assess whether a temporary filter would be an option for him preoperatively since we are unable to anticoagulate him for 5 days postop.  I had an extensive discussion with the patient concerning the pathology relevant anatomy and treatment options. At this point exhausting conservative treatment and in the presence of a neurologic deficit we discussed microlumbar decompression. I discussed the risks and benefits including bleeding, infection, DVT, PE, anesthetic complications, worsening in their symptoms, improvement in their symptoms, C SF leakage, epidural fibrosis, need for future surgeries such as revision discectomy and lumbar fusion. I also indicated that this is an operation to basically decompress the nerve roots to allow recovery as opposed to fixing a herniated disc if it is encountered and that the incidence of recurrent chest disc herniation can approach 15%. Also that nerve root recovery is variable and may not recover completely. Any ligament or bone that is contributing to compressing the nerves will be removed as well.  I discussed the operative course including overnight in the  hospital. Immediate ambulation. Follow-up in 2 weeks for suture removal. 6 weeks until healing of the herniation and surgical incision followed by 6 weeks of reconditioning and strengthening of the core musculature. Also discussed the need to employ the concepts of disc pressure management and core motion following the surgery to minimize the risk of recurrent disc herniation. We will obtain preoperative clearance i if necessary and proceed accordingly.  We will obtain preoperative clearance. I appreciate the kind referral.  Plan microlumbar decompression L3-4, L4-5  Cecilie Kicks, PA-C for Dr [name] 04/10/2022, 12:05 PM

## 2022-04-10 NOTE — Progress Notes (Signed)
Anesthesia Chart Review:  Case: [id number] Date/Time: 04/17/22 1215   Procedure: Microlumbar decompression L3-4, L4-5 - 3 C-Bed   Anesthesia type: General   Pre-op diagnosis: Stenosis L3-4, L4-5   Location: [facility] OR ROOM 04 / [place] [facility]   Surgeons: Susa Day, MD       DISCUSSION:  Patient is a 86 year male scheduled for the above procedure.  Surgery was initially scheduled for 02/06/2022, but 02/03/22 A1c came back at 8.8%. He has since had PCP follow-up, and A1c down to 7.2% on 03/17/22 ([place]).   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 S/p IVC placed 12/26/21 due to DVT/PE history with planned back surgery.    - Preoperative cardiology input outlined on 01/16/22 by [name], NP: "Chart reviewed as part of pre-operative protocol coverage. Given past medical history and time since last visit, based on ACC/AHA guidelines, Ivan [name] would be at acceptable risk for the planned procedure without further cardiovascular testing.    His aspirin may be held for 5 days prior to his procedure.  Please resume as soon as hemostasis is achieved."   - Vascular surgery input per [name], MD on 12/26/21: "Okay to proceed with spinal surgery at any point from my standpoint.  Patient should start pharmacologic DVT prophylaxis as soon as possible after surgery at [name] discretion.  We will make an appointment for IVC filter retrieval in 3 months time."   Diabetes is better controlled now with A1c < 7.5%. Previous cardiology and vascular surgery input. He denied chest pain and SOB at PAT RN visit. Anesthesia team to evaluate on the day of surgery.   VS: BP (!) 156/54   Pulse (!) 50   Temp 36.5 C (Oral)   Resp 17   Ht '5\' 9"'$  (1.753 m)   Wt 92.4 kg   SpO2 100%   BMI 30.07 kg/m    PROVIDERS: [name], [name],  MD is PCP  [name], MD is cardiologist. 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  Echo done to evaluate for structural abnormalities and for evaluation of murmur. 01/13/22 echo showed normal LVEF and moderate AS.  Since patient was without active cardiac symptoms, he did not recommend any ischemic evaluation.   LABS: Labs from 04/09/22 [facility]) and 03/17/22 ([place]) reviewed. On 03/17/22, A1c 7.2%, BUN 30, creatinine 1.51 (down from 1.75 on 02/19/22), sodium 133, potassium 3.7, AST 18, ALT 16. Creatinine ~ 1.5-1.75 since January 2023.  (all labs ordered are listed, but only abnormal results are displayed)  Labs Reviewed  GLUCOSE, CAPILLARY - Abnormal; Notable for the following components:      Result Value   Glucose-Capillary 180 (*)    All other components within normal limits  CBC - Abnormal; Notable for the following components:   MCHC 37.3 (*)    All other components within normal limits  SURGICAL PCR SCREEN     IMAGES: Xray L-spine 04/07/22. FINDINGS: There is no evidence of lumbar spine fracture. Alignment is normal. Intervertebral disc space narrowing and degenerative endplate changes are present at multiple levels. An IVC filter is noted. There is atherosclerotic calcification of the aorta. There are moderate degenerative changes at the right hip. IMPRESSION: Multilevel degenerative changes in the lumbar spine.  MRI L-spine 12/04/21 ([facility]): IMPRESSION:  1.  Lumbar spondylosis most significant at L3-4 with marked facet arthropathy and diffuse disc bulge causing severe central canal stenosis with compression upon the transversing nerve roots.  2.  L4-5 moderate central canal stenosis.      EKG: 01/07/22:  Sinus rhythm with first-degree AV block Right bundle branch block Left anterior fascicular block Moderate voltage criteria for LVH, may be normal variant     CV: Echo 01/13/22: IMPRESSIONS    1. Left ventricular ejection fraction, by estimation, is 60 to 65%. Left  ventricular ejection fraction by 3D volume is 60 %. The left ventricle has  normal function. Left ventricular endocardial border not optimally defined  to evaluate regional wall  motion. Left ventricular diastolic parameters are consistent with Grade I  diastolic dysfunction (impaired relaxation). The average left ventricular  global longitudinal strain is -12.4 %. The global longitudinal strain is  abnormal.   2. Right ventricular systolic function is normal. The right ventricular  size is normal. Tricuspid regurgitation signal is inadequate for assessing  PA pressure.   3. Left atrial size was moderately dilated.   4. The mitral valve is normal in structure. Trivial mitral valve  regurgitation.   5. The aortic valve is tricuspid. There is moderate calcification of the  aortic valve. There is moderate thickening of the aortic valve. Aortic  valve regurgitation is mild. Moderate aortic valve stenosis. Aortic valve mean gradient measures 17.3 mmHg. Aortic valve  peak gradient measures 30.6 mmHg. Aortic valve area, by VTI measures 1.13  cm.   6. The inferior vena cava is normal in size with greater than 50%  respiratory variability, suggesting right atrial pressure of 3 mmHg.       Past Medical History:  Diagnosis Date   Cancer ([place])    Chronic kidney disease    Diabetes mellitus without complication ([place])    Heart murmur    moderate AS 01/13/22   Hx pulmonary embolism 2012   Left leg DVT-PE (presumably was postop) -> Per patient and wife, he was given 10 days of injections but not placed on DOAC or warfarin; only on aspirin 81 mg   Hyperlipidemia    Hypertension    Lower leg DVT (deep venous thrombosis) ([place]) 2012   -> Noted to have chronic left femoral vein DVT on LEV Dopplers January 2023    Past Surgical History:  Procedure Laterality Date   IVC FILTER INSERTION N/A 12/26/2021   Procedure: IVC  FILTER INSERTION;  Surgeon: Cherre Robins, MD;  Location: [place] [facility];  Service: Cardiovascular;  Laterality: N/A;   IVC VENOGRAPHY N/A 12/26/2021   Procedure: IVC Venography;  Surgeon: Cherre Robins, MD;  Location: [place] [facility];  Service: Cardiovascular;  Laterality: N/A;   PROSTATE SURGERY N/A    REPLACEMENT TOTAL KNEE Bilateral     MEDICATIONS:  amLODipine (NORVASC) 10 MG tablet   Ascorbic Acid (VITAMIN C) 1000 MG tablet   aspirin 81 MG EC tablet   atorvastatin (LIPITOR) 20 MG tablet   carvedilol (COREG) 25 MG tablet   Cholecalciferol (VITAMIN D3 PO)   dorzolamide (TRUSOPT) 2 % ophthalmic solution   famotidine (PEPCID) 20 MG tablet   glimepiride (AMARYL) 4 MG tablet   hydrochlorothiazide (HYDRODIURIL) 25 MG tablet   JARDIANCE 25 MG TABS tablet   latanoprost (XALATAN) 0.005 % ophthalmic solution   losartan (COZAAR) 100 MG tablet   Misc Natural Products (BLOOD SUGAR BALANCE PO)   Multiple Vitamin (MULTIVITAMIN WITH MINERALS) TABS tablet  Probiotic Product (PROBIOTIC PO)   RYBELSUS 3 MG TABS   timolol (TIMOPTIC) 0.5 % ophthalmic solution   No current facility-administered medications for this encounter.    Myra Gianotti, PA-C Surgical Short Stay/Anesthesiology [facility] Phone [phone] [facility] Phone [phone] 04/10/2022 5:53 PM

## 2022-04-10 NOTE — Anesthesia Preprocedure Evaluation (Addendum)
Anesthesia Evaluation  Patient identified by MRN, date of birth, ID band Patient awake    Reviewed: Allergy & Precautions, NPO status , Patient's Chart, lab work & pertinent test results, reviewed documented beta blocker date and time   Airway Mallampati: III  TM Distance: >3 FB Neck ROM: Full    Dental  (+) Partial Upper, Partial Lower, Dental Advisory Given, Caps   Pulmonary neg pulmonary ROS,    Pulmonary exam normal breath sounds clear to auscultation       Cardiovascular hypertension, Pt. on medications and Pt. on home beta blockers + Peripheral Vascular Disease and + DVT  Normal cardiovascular exam+ dysrhythmias + Valvular Problems/Murmurs AI and AS  Rhythm:Regular Rate:Normal  Echo 01/13/22: IMPRESSIONS  1. Left ventricular ejection fraction, by estimation, is 60 to 65%. Left  ventricular ejection fraction by 3D volume is 60 %. The left ventricle has  normal function. Left ventricular endocardial border not optimally defined  to evaluate regional wall  motion. Left ventricular diastolic parameters are consistent with Grade I  diastolic dysfunction (impaired relaxation). The average left ventricular  global longitudinal strain is -12.4 %. The global longitudinal strain is  abnormal.  2. Right ventricular systolic function is normal. The right ventricular  size is normal. Tricuspid regurgitation signal is inadequate for assessing  PA pressure.  3. Left atrial size was moderately dilated.  4. The mitral valve is normal in structure. Trivial mitral valve  regurgitation.  5. The aortic valve is tricuspid. There is moderate calcification of the  aortic valve. There is moderate thickening of the aortic valve. Aortic  valve regurgitation is mild. Moderate aortic valve stenosis. Aortic valve mean gradient measures 17.3 mmHg. Aortic valve  peak gradient measures 30.6 mmHg. Aortic valve area, by VTI measures 1.13  cm.  6.  The inferior vena cava is normal in size with greater than 50%  respiratory variability, suggesting right atrial pressure of 3 mmHg.   Chronic DVT left leg  IVC filter 12/2021  EKG 12/2021 NSR, 1st deg AVB, RBBB+ LAFB   Neuro/Psych Glaucoma negative neurological ROS  negative psych ROS   GI/Hepatic negative GI ROS, Neg liver ROS,   Endo/Other  diabetes, Well Controlled, Type 2, Oral Hypoglycemic Agents  Renal/GU Renal InsufficiencyRenal disease  negative genitourinary   Musculoskeletal  (+) Arthritis , Osteoarthritis,  Lumbar stenosis L3-4, L4-5 Low back pain with left radiculopathy   Abdominal   Peds  Hematology negative hematology ROS (+)   Anesthesia Other Findings   Reproductive/Obstetrics                            Anesthesia Physical Anesthesia Plan  ASA: 3  Anesthesia Plan: General   Post-op Pain Management: Lidocaine infusion*, Ofirmev IV (intra-op)*, Precedex and Dilaudid IV   Induction: Intravenous  PONV Risk Score and Plan: 3 and Treatment may vary due to age or medical condition, Ondansetron and Dexamethasone  Airway Management Planned: Oral ETT and Video Laryngoscope Planned  Additional Equipment: None  Intra-op Plan:   Post-operative Plan: Extubation in OR  Informed Consent: I have reviewed the patients History and Physical, chart, labs and discussed the procedure including the risks, benefits and alternatives for the proposed anesthesia with the patient or authorized representative who has indicated his/her understanding and acceptance.     Dental advisory given  Plan Discussed with: CRNA and Anesthesiologist  Anesthesia Plan Comments: (PAT note written 04/10/2022 by Myra Gianotti, PA-C. )  Anesthesia Quick Evaluation  

## 2022-04-10 NOTE — H&P (View-Only) (Signed)
Ivan [name] is an 86 y.o. male.   Chief Complaint: back and leg pain HPI: Location: low back Severity: pain level 7/10 Duration: 1 months Timing: acute Context: cannot identify Alleviating Factors: sitting Aggravating Factors: standing; walking Associated Symptoms: numbness; tingling; instability; radiation down leg Previous Surgery: none Prior Imaging: x ray; MRI Referred by Molli Barrows, PA-C. Weakness in his left leg. Has to use a walker. He is used to being very active  Past Medical History:  Diagnosis Date   Cancer [facility])    Chronic kidney disease    Diabetes mellitus without complication ([place])    Heart murmur    moderate AS 01/13/22   Hx pulmonary embolism 2012   Left leg DVT-PE (presumably was postop) -> Per patient and wife, he was given 10 days of injections but not placed on DOAC or warfarin; only on aspirin 81 mg   Hyperlipidemia    Hypertension    Lower leg DVT (deep venous thrombosis) ([place]) 2012   -> Noted to have chronic left femoral vein DVT on LEV Dopplers January 2023    Past Surgical History:  Procedure Laterality Date   IVC FILTER INSERTION N/A 12/26/2021   Procedure: IVC FILTER INSERTION;  Surgeon: Cherre Robins, MD;  Location: [place] [facility];  Service: Cardiovascular;  Laterality: N/A;   IVC VENOGRAPHY N/A 12/26/2021   Procedure: IVC Venography;  Surgeon: Cherre Robins, MD;  Location: [place] [facility];  Service: Cardiovascular;  Laterality: N/A;   PROSTATE SURGERY N/A    REPLACEMENT TOTAL KNEE Bilateral     Family History  Family history unknown: Yes   Social History:  reports that he has never smoked. He has never used smokeless tobacco. He reports that he does not currently use alcohol. He reports that he does not use drugs.  Allergies:  Allergies  Allergen Reactions   Oxycontin [Oxycodone] Other (See Comments)    hallucinations    Current meds: amLODIPine 10 mg tablet aspirin 81 mg capsule atorvastatin 20 mg tablet carvediloL  25 mg tablet dorzolamide 2 % eye drops glimepiride 4 mg tablet hydroCHLOROthiazide 12.5 mg tablet latanoprost (PF) 0.005 % eye drops losartan 100 mg tablet multivitamin Pepcid 20 mg tablet Probiotic (B. coagulans) 250 million cell chewable tablet timoloL 0.25 % eye drops Vitamin C 1,000 mg tablet Vitamin D3 125 mcg (5,000 unit) tablet   Results for orders placed or performed during the hospital encounter of 04/09/22 (from the past 48 hour(s))  Glucose, capillary     Status: Abnormal   Collection Time: 04/09/22  9:13 AM  Result Value Ref Range   Glucose-Capillary 180 (H) 70 - 99 mg/dL    Comment: Glucose reference range applies only to samples taken after fasting for at least 8 hours.  CBC per protocol     Status: Abnormal   Collection Time: 04/09/22  9:53 AM  Result Value Ref Range   WBC 8.6 4.0 - 10.5 K/uL   RBC 5.04 4.22 - 5.81 MIL/uL   Hemoglobin 15.4 13.0 - 17.0 g/dL   HCT 41.3 39.0 - 52.0 %   MCV 81.9 80.0 - 100.0 fL   MCH 30.6 26.0 - 34.0 pg   MCHC 37.3 (H) 30.0 - 36.0 g/dL   RDW 12.9 11.5 - 15.5 %   Platelets 216 150 - 400 K/uL   nRBC 0.0 0.0 - 0.2 %    Comment: Performed at [facility], [place] [address], [place], [place] [address]  Surgical pcr screen  Status: None   Collection Time: 04/09/22 11:44 AM   Specimen: Nasal Mucosa; Nasal Swab  Result Value Ref Range   MRSA, PCR NEGATIVE NEGATIVE   Staphylococcus aureus NEGATIVE NEGATIVE    Comment: (NOTE) The Xpert SA Assay (FDA approved for NASAL specimens in patients 27 years of age and older), is one component of a comprehensive surveillance program. It is not intended to diagnose infection nor to guide or monitor treatment. Performed at [facility], [place] [address], [place], [place] [address]    DG Lumbar Spine 2-3 Views  Result Date: 04/10/2022 CLINICAL DATA:  Preoperative imaging. EXAM: LUMBAR SPINE - 2-3 VIEW COMPARISON:  02/03/2022. FINDINGS: There is no evidence of lumbar spine fracture.  Alignment is normal. Intervertebral disc space narrowing and degenerative endplate changes are present at multiple levels. An IVC filter is noted. There is atherosclerotic calcification of the aorta. There are moderate degenerative changes at the right hip. IMPRESSION: Multilevel degenerative changes in the lumbar spine. Electronically Signed   By: Brett Fairy M.D.   On: 04/10/2022 02:36    Review of Systems  Constitutional: Negative.   HENT: Negative.    Eyes: Negative.   Respiratory: Negative.    Cardiovascular: Negative.   Gastrointestinal: Negative.   Endocrine: Negative.   Genitourinary: Negative.   Musculoskeletal:  Positive for back pain and gait problem.  Skin: Negative.   Neurological:  Positive for weakness and numbness.  Psychiatric/Behavioral: Negative.     There were no vitals taken for this visit. Physical Exam Constitutional:      Appearance: Normal appearance.  HENT:     Head: Normocephalic and atraumatic.     Right Ear: External ear normal.     Left Ear: External ear normal.     Nose: Nose normal.     Mouth/Throat:     Pharynx: Oropharynx is clear.  Eyes:     Conjunctiva/sclera: Conjunctivae normal.  Cardiovascular:     Rate and Rhythm: Normal rate and regular rhythm.     Pulses: Normal pulses.     Heart sounds: Normal heart sounds.  Pulmonary:     Effort: Pulmonary effort is normal.     Breath sounds: Normal breath sounds.  Abdominal:     General: Bowel sounds are normal.  Musculoskeletal:     Cervical back: Normal range of motion.     Comments: Gait and Station: Appearance: ambulating with no assistive devices and antalgic gait.  Constitutional: General Appearance: healthy-appearing and distress (mild).  Psychiatric: Mood and Affect: active and alert.  Cardiovascular System: Edema Right: none; Dorsalis and posterior tibial pulses 2+. Edema Left: none.  Abdomen: Inspection and Palpation: non-distended and no tenderness.  Skin: Inspection and  palpation: no rash.  Lumbar Spine: Inspection: normal alignment. Bony Palpation of the Lumbar Spine: tender at lumbosacral junction.. Bony Palpation of the Right Hip: no tenderness of the greater trochanter and tenderness of the SI joint; Pelvis stable. Bony Palpation of the Left Hip: no tenderness of the greater trochanter and tenderness of the SI joint. Soft Tissue Palpation on the Right: No flank pain with percussion. Active Range of Motion: limited flexion and extention.  Motor Strength: L1 Motor Strength on the Right: hip flexion iliopsoas 5/5. L1 Motor Strength on the Left: hip flexion iliopsoas 3/5. L2-L4 Motor Strength on the Right: knee extension quadriceps 5/5. L2-L4 Motor Strength on the Left: knee extension quadriceps 3/5. L5 Motor Strength on the Right: ankle dorsiflexion tibialis anterior 5/5 and great toe extension extensor hallucis  longus 5/5. L5 Motor Strength on the Left: ankle dorsiflexion tibialis anterior 5/5 and great toe extension extensor hallucis longus 5/5. S1 Motor Strength on the Right: plantar flexion gastrocnemius 5/5. S1 Motor Strength on the Left: plantar flexion gastrocnemius 5/5.  Neurological System: Knee Reflex Right: normal (2). Knee Reflex Left: normal (2). Ankle Reflex Right: normal (2). Ankle Reflex Left: normal (2). Babinski Reflex Right: plantar reflex absent. Babinski Reflex Left: plantar reflex absent. Sensation on the Right: normal distal extremities. Sensation on the Left: normal distal extremities. Special Tests on the Right: no clonus of the ankle/knee. Special Tests on the Left: no clonus of the ankle/knee and seated straight leg raising test positive.  Neurological:     Mental Status: He is alert.    Three-view x-rays of the lumbar spine demonstrates multilevel disc degeneration. Mild calcification the aorta. No instability on flexion-extension. 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  MRI outside indicates severe spinal stenosis at L3-4 moderately severe  at L4-5.  Assessment/Plan Impression:  1. 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 Quadricep weakness left 3/5 with associated hip flexor weakness. 3. History of DVT and PE remote. Currently on aspirin  Plan:  I discussion concerning his pathology valve anatomy and treatment options. Given the profound deficit in the weakness in his left leg and with his hip flexor with severe stenosis at L3-4 appropriate to consider lumbar decompression at L3-4 and likely at L4-5. He is otherwise relatively healthy. He used to be able to walk he is relegated to a walker now  I do not have the details of his DVT PE. I recommend bilateral lower extremity ultrasounds to evaluate for baseline deep venous thrombosis. I also recommend referral to vascular surgeon in town to assess whether a temporary filter would be an option for him preoperatively since we are unable to anticoagulate him for 5 days postop.  I had an extensive discussion with the patient concerning the pathology relevant anatomy and treatment options. At this point exhausting conservative treatment and in the presence of a neurologic deficit we discussed microlumbar decompression. I discussed the risks and benefits including bleeding, infection, DVT, PE, anesthetic complications, worsening in their symptoms, improvement in their symptoms, C SF leakage, epidural fibrosis, need for future surgeries such as revision discectomy and lumbar fusion. I also indicated that this is an operation to basically decompress the nerve roots to allow recovery as opposed to fixing a herniated disc if it is encountered and that the incidence of recurrent chest disc herniation can approach 15%. Also that nerve root recovery is variable and may not recover completely. Any ligament or bone that is contributing to compressing the nerves will be removed as well.  I discussed the operative course including overnight in the  hospital. Immediate ambulation. Follow-up in 2 weeks for suture removal. 6 weeks until healing of the herniation and surgical incision followed by 6 weeks of reconditioning and strengthening of the core musculature. Also discussed the need to employ the concepts of disc pressure management and core motion following the surgery to minimize the risk of recurrent disc herniation. We will obtain preoperative clearance i if necessary and proceed accordingly.  We will obtain preoperative clearance. I appreciate the kind referral.  Plan microlumbar decompression L3-4, L4-5  Cecilie Kicks, PA-C for Dr [name] 04/10/2022, 12:05 PM

## 2022-04-17 ENCOUNTER — Ambulatory Visit (HOSPITAL_COMMUNITY): Payer: Medicare HMO | Admitting: Vascular Surgery

## 2022-04-17 ENCOUNTER — Encounter (HOSPITAL_COMMUNITY): Payer: Self-pay | Admitting: Specialist

## 2022-04-17 ENCOUNTER — Ambulatory Visit (HOSPITAL_COMMUNITY)
Admission: RE | Admit: 2022-04-17 | Discharge: 2022-04-18 | Disposition: A | Discharge status: Home / Self Care | Payer: Medicare HMO | Source: Ambulatory Visit | Attending: Specialist | Admitting: Specialist

## 2022-04-17 ENCOUNTER — Other Ambulatory Visit: Payer: Self-pay

## 2022-04-17 ENCOUNTER — Ambulatory Visit (HOSPITAL_BASED_OUTPATIENT_CLINIC_OR_DEPARTMENT_OTHER): Payer: Medicare HMO | Admitting: Certified Registered Nurse Anesthetist

## 2022-04-17 ENCOUNTER — Ambulatory Visit (HOSPITAL_COMMUNITY): Payer: Medicare HMO

## 2022-04-17 ENCOUNTER — Encounter (HOSPITAL_COMMUNITY)
Admission: RE | Disposition: A | Discharge status: Home / Self Care | Payer: Self-pay | Source: Ambulatory Visit | Attending: Specialist

## 2022-04-17 DIAGNOSIS — Z7982 Long term (current) use of aspirin: Secondary | ICD-10-CM | POA: Insufficient documentation

## 2022-04-17 DIAGNOSIS — Z79899 Other long term (current) drug therapy: Secondary | ICD-10-CM | POA: Insufficient documentation

## 2022-04-17 DIAGNOSIS — I10 Essential (primary) hypertension: Secondary | ICD-10-CM

## 2022-04-17 DIAGNOSIS — M48062 Spinal stenosis, lumbar region with neurogenic claudication: Secondary | ICD-10-CM | POA: Diagnosis not present

## 2022-04-17 DIAGNOSIS — M5416 Radiculopathy, lumbar region: Secondary | ICD-10-CM | POA: Insufficient documentation

## 2022-04-17 DIAGNOSIS — H409 Unspecified glaucoma: Secondary | ICD-10-CM | POA: Insufficient documentation

## 2022-04-17 DIAGNOSIS — Z86711 Personal history of pulmonary embolism: Secondary | ICD-10-CM | POA: Insufficient documentation

## 2022-04-17 DIAGNOSIS — I129 Hypertensive chronic kidney disease with stage 1 through stage 4 chronic kidney disease, or unspecified chronic kidney disease: Secondary | ICD-10-CM | POA: Diagnosis not present

## 2022-04-17 DIAGNOSIS — M48061 Spinal stenosis, lumbar region without neurogenic claudication: Secondary | ICD-10-CM | POA: Diagnosis present

## 2022-04-17 DIAGNOSIS — E1151 Type 2 diabetes mellitus with diabetic peripheral angiopathy without gangrene: Secondary | ICD-10-CM | POA: Diagnosis not present

## 2022-04-17 DIAGNOSIS — Z7984 Long term (current) use of oral hypoglycemic drugs: Secondary | ICD-10-CM | POA: Diagnosis not present

## 2022-04-17 DIAGNOSIS — E1169 Type 2 diabetes mellitus with other specified complication: Secondary | ICD-10-CM

## 2022-04-17 DIAGNOSIS — Z86718 Personal history of other venous thrombosis and embolism: Secondary | ICD-10-CM | POA: Insufficient documentation

## 2022-04-17 DIAGNOSIS — N189 Chronic kidney disease, unspecified: Secondary | ICD-10-CM | POA: Diagnosis not present

## 2022-04-17 DIAGNOSIS — Z01818 Encounter for other preprocedural examination: Secondary | ICD-10-CM

## 2022-04-17 HISTORY — PX: LUMBAR LAMINECTOMY/DECOMPRESSION MICRODISCECTOMY: SHX5026

## 2022-04-17 LAB — GLUCOSE, CAPILLARY
Glucose-Capillary: 166 mg/dL — ABNORMAL HIGH (ref 70–99)
Glucose-Capillary: 117 mg/dL — ABNORMAL HIGH (ref 70–99)
Glucose-Capillary: 247 mg/dL — ABNORMAL HIGH (ref 70–99)

## 2022-04-17 SURGERY — LUMBAR LAMINECTOMY/DECOMPRESSION MICRODISCECTOMY 2 LEVELS
Anesthesia: General | Site: Spine Lumbar

## 2022-04-17 MED ORDER — LIDOCAINE 2% (20 MG/ML) 5 ML SYRINGE
INTRAMUSCULAR | Status: DC | PRN
  Administered 2022-04-17: 80 mg via INTRAVENOUS

## 2022-04-17 MED ORDER — EPHEDRINE 5 MG/ML INJ
INTRAVENOUS | Status: AC
  Filled 2022-04-17: qty 10

## 2022-04-17 MED ORDER — INSULIN ASPART 100 UNIT/ML IJ SOLN
0.0000 [IU] | Freq: Every day | INTRAMUSCULAR | Status: DC
  Administered 2022-04-17: 2 [IU] via SUBCUTANEOUS

## 2022-04-17 MED ORDER — INSULIN ASPART 100 UNIT/ML IJ SOLN: 0.0000 [IU] | INTRAMUSCULAR | Status: DC | PRN

## 2022-04-17 MED ORDER — PHENOL 1.4 % MT LIQD: 1.0000 | OROMUCOSAL | Status: DC | PRN

## 2022-04-17 MED ORDER — ONDANSETRON HCL 4 MG/2ML IJ SOLN
4.0000 mg | Freq: Four times a day (QID) | INTRAMUSCULAR | Status: DC | PRN
  Administered 2022-04-17: 4 mg via INTRAVENOUS
  Filled 2022-04-17: qty 2

## 2022-04-17 MED ORDER — SEMAGLUTIDE 3 MG PO TABS: 3.0000 mg | ORAL_TABLET | Freq: Every day | ORAL | Status: DC

## 2022-04-17 MED ORDER — DORZOLAMIDE HCL 2 % OP SOLN
1.0000 [drp] | Freq: Two times a day (BID) | OPHTHALMIC | Status: DC
  Administered 2022-04-17 – 2022-04-18 (×2): 1 [drp] via OPHTHALMIC
  Filled 2022-04-17: qty 10

## 2022-04-17 MED ORDER — HYDROCHLOROTHIAZIDE 25 MG PO TABS
25.0000 mg | ORAL_TABLET | Freq: Every day | ORAL | Status: DC
  Administered 2022-04-17: 25 mg via ORAL
  Filled 2022-04-17: qty 1

## 2022-04-17 MED ORDER — METHOCARBAMOL 500 MG PO TABS
500.0000 mg | ORAL_TABLET | Freq: Four times a day (QID) | ORAL | Status: DC | PRN
  Administered 2022-04-17: 500 mg via ORAL
  Filled 2022-04-17: qty 1

## 2022-04-17 MED ORDER — ROCURONIUM BROMIDE 10 MG/ML (PF) SYRINGE
PREFILLED_SYRINGE | INTRAVENOUS | Status: DC | PRN
  Administered 2022-04-17: 60 mg via INTRAVENOUS
  Administered 2022-04-17: 10 mg via INTRAVENOUS
  Administered 2022-04-17: 200 mg via INTRAVENOUS

## 2022-04-17 MED ORDER — CARVEDILOL 25 MG PO TABS
25.0000 mg | ORAL_TABLET | Freq: Two times a day (BID) | ORAL | Status: DC
  Administered 2022-04-17 – 2022-04-18 (×2): 25 mg via ORAL
  Filled 2022-04-17 (×2): qty 1

## 2022-04-17 MED ORDER — ACETAMINOPHEN 650 MG RE SUPP: 650.0000 mg | RECTAL | Status: DC | PRN

## 2022-04-17 MED ORDER — POLYETHYLENE GLYCOL 3350 17 G PO PACK: 17.0000 g | PACK | Freq: Every day | ORAL | Status: DC | PRN

## 2022-04-17 MED ORDER — ONDANSETRON HCL 4 MG/2ML IJ SOLN
INTRAMUSCULAR | Status: DC | PRN
  Administered 2022-04-17: 4 mg via INTRAVENOUS

## 2022-04-17 MED ORDER — BUPIVACAINE-EPINEPHRINE 0.5% -1:200000 IJ SOLN
INTRAMUSCULAR | Status: DC | PRN
  Administered 2022-04-17: 6 mL

## 2022-04-17 MED ORDER — BUPIVACAINE-EPINEPHRINE 0.5% -1:200000 IJ SOLN
INTRAMUSCULAR | Status: AC
  Filled 2022-04-17: qty 1

## 2022-04-17 MED ORDER — HYDROCODONE-ACETAMINOPHEN 5-325 MG PO TABS
1.0000 | ORAL_TABLET | ORAL | Status: DC | PRN
  Administered 2022-04-18: 1 via ORAL
  Filled 2022-04-17: qty 1

## 2022-04-17 MED ORDER — INSULIN ASPART 100 UNIT/ML IJ SOLN: 0.0000 [IU] | Freq: Three times a day (TID) | INTRAMUSCULAR | Status: DC

## 2022-04-17 MED ORDER — ACETAMINOPHEN 325 MG PO TABS: 650.0000 mg | ORAL_TABLET | ORAL | Status: DC | PRN

## 2022-04-17 MED ORDER — LACTATED RINGERS IV SOLN: INTRAVENOUS | Status: DC

## 2022-04-17 MED ORDER — CEFAZOLIN SODIUM-DEXTROSE 2-4 GM/100ML-% IV SOLN
2.0000 g | Freq: Three times a day (TID) | INTRAVENOUS | Status: AC
  Administered 2022-04-17 – 2022-04-18 (×2): 2 g via INTRAVENOUS
  Filled 2022-04-17 (×2): qty 100

## 2022-04-17 MED ORDER — TRANEXAMIC ACID-NACL 1000-0.7 MG/100ML-% IV SOLN
1000.0000 mg | INTRAVENOUS | Status: AC
  Administered 2022-04-17: 1000 mg via INTRAVENOUS
  Filled 2022-04-17: qty 100

## 2022-04-17 MED ORDER — EPHEDRINE SULFATE-NACL 50-0.9 MG/10ML-% IV SOSY
PREFILLED_SYRINGE | INTRAVENOUS | Status: DC | PRN
  Administered 2022-04-17 (×6): 5 mg via INTRAVENOUS

## 2022-04-17 MED ORDER — HYDROCODONE-ACETAMINOPHEN 5-325 MG PO TABS
2.0000 | ORAL_TABLET | ORAL | Status: DC | PRN
  Administered 2022-04-17: 2 via ORAL
  Filled 2022-04-17: qty 2

## 2022-04-17 MED ORDER — ORAL CARE MOUTH RINSE: 15.0000 mL | Freq: Once | OROMUCOSAL | Status: AC

## 2022-04-17 MED ORDER — TAMSULOSIN HCL 0.4 MG PO CAPS
0.4000 mg | ORAL_CAPSULE | Freq: Every day | ORAL | Status: DC
  Administered 2022-04-17 – 2022-04-18 (×2): 0.4 mg via ORAL
  Filled 2022-04-17 (×2): qty 1

## 2022-04-17 MED ORDER — HYDROCODONE-ACETAMINOPHEN 5-325 MG PO TABS: 1.0000 | ORAL_TABLET | ORAL | 0 refills | Status: AC | PRN

## 2022-04-17 MED ORDER — DEXAMETHASONE SODIUM PHOSPHATE 10 MG/ML IJ SOLN
INTRAMUSCULAR | Status: AC
  Filled 2022-04-17: qty 1

## 2022-04-17 MED ORDER — SODIUM CHLORIDE 0.9 % IV SOLN
INTRAVENOUS | Status: DC | PRN
  Administered 2022-04-17: 500 mL

## 2022-04-17 MED ORDER — VITAMIN D 25 MCG (1000 UNIT) PO TABS
1000.0000 [IU] | ORAL_TABLET | Freq: Every morning | ORAL | Status: DC
  Administered 2022-04-18: 1000 [IU] via ORAL
  Filled 2022-04-17: qty 1

## 2022-04-17 MED ORDER — FAMOTIDINE 20 MG PO TABS
20.0000 mg | ORAL_TABLET | Freq: Every day | ORAL | Status: DC
  Administered 2022-04-17: 20 mg via ORAL
  Filled 2022-04-17: qty 1

## 2022-04-17 MED ORDER — ONDANSETRON HCL 4 MG/2ML IJ SOLN
INTRAMUSCULAR | Status: AC
  Filled 2022-04-17: qty 2

## 2022-04-17 MED ORDER — HYDROMORPHONE HCL 1 MG/ML IJ SOLN: 0.5000 mg | INTRAMUSCULAR | Status: DC | PRN

## 2022-04-17 MED ORDER — LOSARTAN POTASSIUM 50 MG PO TABS
100.0000 mg | ORAL_TABLET | Freq: Every day | ORAL | Status: DC
  Administered 2022-04-17: 100 mg via ORAL
  Filled 2022-04-17: qty 2

## 2022-04-17 MED ORDER — FENTANYL CITRATE (PF) 250 MCG/5ML IJ SOLN
INTRAMUSCULAR | Status: AC
  Filled 2022-04-17: qty 5

## 2022-04-17 MED ORDER — DOCUSATE SODIUM 100 MG PO CAPS: 100.0000 mg | ORAL_CAPSULE | Freq: Two times a day (BID) | ORAL | 1 refills | Status: AC | PRN

## 2022-04-17 MED ORDER — ONDANSETRON HCL 4 MG PO TABS
4.0000 mg | ORAL_TABLET | Freq: Four times a day (QID) | ORAL | Status: DC | PRN
Start: 2022-04-17 — End: 2022-04-18
  Administered 2022-04-18: 4 mg via ORAL
  Filled 2022-04-17: qty 1

## 2022-04-17 MED ORDER — DOCUSATE SODIUM 100 MG PO CAPS
100.0000 mg | ORAL_CAPSULE | Freq: Two times a day (BID) | ORAL | Status: DC
  Administered 2022-04-17 – 2022-04-18 (×2): 100 mg via ORAL
  Filled 2022-04-17 (×2): qty 1

## 2022-04-17 MED ORDER — PROPOFOL 10 MG/ML IV BOLUS
INTRAVENOUS | Status: AC
  Filled 2022-04-17: qty 20

## 2022-04-17 MED ORDER — ACETAMINOPHEN 10 MG/ML IV SOLN
1000.0000 mg | INTRAVENOUS | Status: AC
  Administered 2022-04-17: 1000 mg via INTRAVENOUS
  Filled 2022-04-17: qty 100

## 2022-04-17 MED ORDER — LIDOCAINE 2% (20 MG/ML) 5 ML SYRINGE
INTRAMUSCULAR | Status: AC
  Filled 2022-04-17: qty 5

## 2022-04-17 MED ORDER — AMLODIPINE BESYLATE 5 MG PO TABS
10.0000 mg | ORAL_TABLET | Freq: Every morning | ORAL | Status: DC
  Administered 2022-04-18: 10 mg via ORAL
  Filled 2022-04-17: qty 2

## 2022-04-17 MED ORDER — POLYETHYLENE GLYCOL 3350 17 G PO PACK: 17.0000 g | PACK | Freq: Every day | ORAL | 0 refills | Status: AC

## 2022-04-17 MED ORDER — INSULIN ASPART 100 UNIT/ML IJ SOLN
0.0000 [IU] | Freq: Three times a day (TID) | INTRAMUSCULAR | Status: DC
  Administered 2022-04-18: 3 [IU] via SUBCUTANEOUS

## 2022-04-17 MED ORDER — LATANOPROST 0.005 % OP SOLN
1.0000 [drp] | Freq: Every day | OPHTHALMIC | Status: DC
  Administered 2022-04-17: 1 [drp] via OPHTHALMIC
  Filled 2022-04-17: qty 2.5

## 2022-04-17 MED ORDER — HYDROMORPHONE HCL 1 MG/ML IJ SOLN: 0.2500 mg | INTRAMUSCULAR | Status: DC | PRN

## 2022-04-17 MED ORDER — FENTANYL CITRATE (PF) 250 MCG/5ML IJ SOLN
INTRAMUSCULAR | Status: DC | PRN
  Administered 2022-04-17: 50 ug via INTRAVENOUS
  Administered 2022-04-17: 150 ug via INTRAVENOUS

## 2022-04-17 MED ORDER — ONDANSETRON HCL 4 MG/2ML IJ SOLN: 4.0000 mg | Freq: Once | INTRAMUSCULAR | Status: DC | PRN

## 2022-04-17 MED ORDER — ROCURONIUM BROMIDE 10 MG/ML (PF) SYRINGE
PREFILLED_SYRINGE | INTRAVENOUS | Status: AC
  Filled 2022-04-17: qty 10

## 2022-04-17 MED ORDER — MENTHOL 3 MG MT LOZG
1.0000 | LOZENGE | OROMUCOSAL | Status: DC | PRN
Start: 2022-04-17 — End: 2022-04-18
  Administered 2022-04-17: 3 mg via ORAL
  Filled 2022-04-17: qty 9

## 2022-04-17 MED ORDER — CEFAZOLIN SODIUM-DEXTROSE 2-4 GM/100ML-% IV SOLN
2.0000 g | INTRAVENOUS | Status: AC
  Administered 2022-04-17: 2 g via INTRAVENOUS
  Filled 2022-04-17: qty 100

## 2022-04-17 MED ORDER — EMPAGLIFLOZIN 25 MG PO TABS
25.0000 mg | ORAL_TABLET | Freq: Every day | ORAL | Status: DC
  Administered 2022-04-17 – 2022-04-18 (×2): 25 mg via ORAL
  Filled 2022-04-17 (×2): qty 1

## 2022-04-17 MED ORDER — PROPOFOL 10 MG/ML IV BOLUS
INTRAVENOUS | Status: DC | PRN
  Administered 2022-04-17: 120 mg via INTRAVENOUS

## 2022-04-17 MED ORDER — CHLORHEXIDINE GLUCONATE 0.12 % MT SOLN
15.0000 mL | Freq: Once | OROMUCOSAL | Status: AC
  Administered 2022-04-17: 15 mL via OROMUCOSAL
  Filled 2022-04-17: qty 15

## 2022-04-17 MED ORDER — THROMBIN 20000 UNITS EX SOLR
CUTANEOUS | Status: DC | PRN
  Administered 2022-04-17: 20 mL via TOPICAL

## 2022-04-17 MED ORDER — POTASSIUM CHLORIDE IN NACL 20-0.45 MEQ/L-% IV SOLN
INTRAVENOUS | Status: DC
  Filled 2022-04-17: qty 1000

## 2022-04-17 MED ORDER — METHOCARBAMOL 1000 MG/10ML IJ SOLN: 500.0000 mg | Freq: Four times a day (QID) | INTRAVENOUS | Status: DC | PRN

## 2022-04-17 MED ORDER — ASCORBIC ACID 500 MG PO TABS
1000.0000 mg | ORAL_TABLET | Freq: Every morning | ORAL | Status: DC
  Administered 2022-04-18: 1000 mg via ORAL
  Filled 2022-04-17: qty 2

## 2022-04-17 MED ORDER — SACCHAROMYCES BOULARDII 250 MG PO CAPS
250.0000 mg | ORAL_CAPSULE | Freq: Every morning | ORAL | Status: DC
  Administered 2022-04-18: 250 mg via ORAL
  Filled 2022-04-17: qty 1

## 2022-04-17 MED ORDER — THROMBIN 20000 UNITS EX SOLR
CUTANEOUS | Status: AC
  Filled 2022-04-17: qty 20000

## 2022-04-17 MED ORDER — BISACODYL 5 MG PO TBEC: 5.0000 mg | DELAYED_RELEASE_TABLET | Freq: Every day | ORAL | Status: DC | PRN

## 2022-04-17 MED ORDER — 0.9 % SODIUM CHLORIDE (POUR BTL) OPTIME
TOPICAL | Status: DC | PRN
  Administered 2022-04-17: 1000 mL

## 2022-04-17 MED ORDER — ALUM & MAG HYDROXIDE-SIMETH 200-200-20 MG/5ML PO SUSP: 30.0000 mL | Freq: Four times a day (QID) | ORAL | Status: DC | PRN

## 2022-04-17 MED ORDER — TIMOLOL MALEATE 0.5 % OP SOLN
1.0000 [drp] | Freq: Two times a day (BID) | OPHTHALMIC | Status: DC
  Administered 2022-04-17 – 2022-04-18 (×2): 1 [drp] via OPHTHALMIC
  Filled 2022-04-17: qty 5

## 2022-04-17 SURGICAL SUPPLY — 64 items
BAG COUNTER SPONGE SURGICOUNT (BAG) ×2 IMPLANT
BAG DECANTER FOR FLEXI CONT (MISCELLANEOUS) ×1 IMPLANT
BAND RUBBER #18 3X1/16 STRL (MISCELLANEOUS) ×2 IMPLANT
BUR EGG ELITE 4.0 (BURR) ×1 IMPLANT
BUR RND DIAMOND ELITE 4.0 (BURR) IMPLANT
BUR SABER DIAMOND 5.0 (BURR) ×1 IMPLANT
BUR STRYKR EGG 5.0 (BURR) IMPLANT
CARTRIDGE OIL MAESTRO DRILL (MISCELLANEOUS) IMPLANT
CLEANER TIP ELECTROSURG 2X2 (MISCELLANEOUS) ×2 IMPLANT
CNTNR URN SCR LID CUP LEK RST (MISCELLANEOUS) ×1 IMPLANT
CONT SPEC 4OZ STRL OR WHT (MISCELLANEOUS)
DIFFUSER DRILL AIR PNEUMATIC (MISCELLANEOUS) ×1 IMPLANT
DRAPE LAPAROTOMY 100X72X124 (DRAPES) ×2 IMPLANT
DRAPE MICROSCOPE LEICA (MISCELLANEOUS) ×2 IMPLANT
DRAPE SHEET LG 3/4 BI-LAMINATE (DRAPES) ×1 IMPLANT
DRAPE SURG 17X11 SM STRL (DRAPES) ×2 IMPLANT
DRAPE UTILITY XL STRL (DRAPES) ×2 IMPLANT
DRSG AQUACEL AG ADV 3.5X 4 (GAUZE/BANDAGES/DRESSINGS) IMPLANT
DRSG AQUACEL AG ADV 3.5X 6 (GAUZE/BANDAGES/DRESSINGS) ×1 IMPLANT
DRSG TELFA 3X8 NADH (GAUZE/BANDAGES/DRESSINGS) IMPLANT
DURAPREP 26ML APPLICATOR (WOUND CARE) ×2 IMPLANT
DURASEAL SPINE SEALANT 3ML (MISCELLANEOUS) IMPLANT
ELECT BLADE 4.0 EZ CLEAN MEGAD (MISCELLANEOUS)
ELECT REM PT RETURN 9FT ADLT (ELECTROSURGICAL) ×2
ELECTRODE BLDE 4.0 EZ CLN MEGD (MISCELLANEOUS) IMPLANT
ELECTRODE REM PT RTRN 9FT ADLT (ELECTROSURGICAL) ×1 IMPLANT
GLOVE BIOGEL PI IND STRL 7.5 (GLOVE) ×1 IMPLANT
GLOVE BIOGEL PI INDICATOR 7.5 (GLOVE) ×1
GLOVE SS BIOGEL STRL SZ 7 (GLOVE) ×1 IMPLANT
GLOVE SS BIOGEL STRL SZ 8 (GLOVE) ×2 IMPLANT
GLOVE SUPERSENSE BIOGEL SZ 7 (GLOVE) ×1
GLOVE SUPERSENSE BIOGEL SZ 8 (GLOVE) ×2
GOWN STRL REUS W/ TWL LRG LVL3 (GOWN DISPOSABLE) ×1 IMPLANT
GOWN STRL REUS W/ TWL XL LVL3 (GOWN DISPOSABLE) ×1 IMPLANT
GOWN STRL REUS W/TWL LRG LVL3 (GOWN DISPOSABLE) ×2
GOWN STRL REUS W/TWL XL LVL3 (GOWN DISPOSABLE) ×2
IV CATH 14GX2 1/4 (CATHETERS) ×2 IMPLANT
KIT BASIN OR (CUSTOM PROCEDURE TRAY) ×2 IMPLANT
KIT POSITION SURG JACKSON T1 (MISCELLANEOUS) IMPLANT
NDL SPNL 18GX3.5 QUINCKE PK (NEEDLE) ×2 IMPLANT
NEEDLE 22X1 1/2 (OR ONLY) (NEEDLE) ×2 IMPLANT
NEEDLE SPNL 18GX3.5 QUINCKE PK (NEEDLE) ×4 IMPLANT
OIL CARTRIDGE MAESTRO DRILL (MISCELLANEOUS)
PACK LAMINECTOMY NEURO (CUSTOM PROCEDURE TRAY) ×2 IMPLANT
PAD DRESSING TELFA 3X8 NADH (GAUZE/BANDAGES/DRESSINGS) IMPLANT
PATTIES SURGICAL .75X.75 (GAUZE/BANDAGES/DRESSINGS) ×2 IMPLANT
SPONGE SURGIFOAM ABS GEL 100 (HEMOSTASIS) ×2 IMPLANT
SPONGE T-LAP 4X18 ~~LOC~~+RFID (SPONGE) IMPLANT
STAPLER VISISTAT (STAPLE) ×1 IMPLANT
STRIP CLOSURE SKIN 1/2X4 (GAUZE/BANDAGES/DRESSINGS) ×1 IMPLANT
SUT NURALON 4 0 TR CR/8 (SUTURE) IMPLANT
SUT PROLENE 3 0 PS 2 (SUTURE) IMPLANT
SUT VIC AB 1 CT1 27 (SUTURE) ×4
SUT VIC AB 1 CT1 27XBRD ANTBC (SUTURE) IMPLANT
SUT VIC AB 1-0 CT2 27 (SUTURE) ×1 IMPLANT
SUT VIC AB 2-0 CT1 27 (SUTURE) ×2
SUT VIC AB 2-0 CT1 TAPERPNT 27 (SUTURE) IMPLANT
SUT VIC AB 2-0 CT2 27 (SUTURE) ×1 IMPLANT
SYR 3ML LL SCALE MARK (SYRINGE) ×2 IMPLANT
TOWEL GREEN STERILE (TOWEL DISPOSABLE) ×2 IMPLANT
TOWEL GREEN STERILE FF (TOWEL DISPOSABLE) ×2 IMPLANT
TRAY FOLEY MTR SLVR 16FR STAT (SET/KITS/TRAYS/PACK) ×2 IMPLANT
WIPE CHG CHLORHEXIDINE 2% (PERSONAL CARE ITEMS) ×2 IMPLANT
YANKAUER SUCT BULB TIP NO VENT (SUCTIONS) ×2 IMPLANT

## 2022-04-17 NOTE — Interval H&P Note (Signed)
History and Physical Interval Note:  04/17/2022 12:46 PM  Ivan [name]  has presented today for surgery, with the diagnosis of Stenosis L3-4, L4-5.  The various methods of treatment have been discussed with the patient and family. After consideration of risks, benefits and other options for treatment, the patient has consented to  Procedure(s) with comments: Microlumbar decompression L3-4, L4-5 (N/A) - 3 C-Bed as a surgical intervention.  The patient's history has been reviewed, patient examined, no change in status, stable for surgery.  I have reviewed the patient's chart and labs.  Questions were answered to the patient's satisfaction.     Johnn Hai

## 2022-04-17 NOTE — Discharge Instructions (Signed)

## 2022-04-17 NOTE — Anesthesia Procedure Notes (Addendum)
Procedure Name: Intubation Date/Time: 04/17/2022 1:07 PM Performed by: Michele Rockers, CRNA Pre-anesthesia Checklist: Patient identified, Patient being monitored, Timeout performed, Emergency Drugs available and Suction available Patient Re-evaluated:Patient Re-evaluated prior to induction Oxygen Delivery Method: Circle system utilized Preoxygenation: Pre-oxygenation with 100% oxygen Induction Type: IV induction Ventilation: Mask ventilation without difficulty Laryngoscope Size: Mac, 3 and Glidescope Grade View: Grade I Tube type: Oral Tube size: 8.0 mm Number of attempts: 1 Airway Equipment and Method: Stylet Placement Confirmation: ETT inserted through vocal cords under direct vision, positive ETCO2 and breath sounds checked- equal and bilateral Secured at: 22 cm Tube secured with: Tape Dental Injury: Teeth and Oropharynx as per pre-operative assessment

## 2022-04-17 NOTE — Brief Op Note (Signed)
04/17/2022  3:34 PM  PATIENT:  Ivan [name]  86 y.o. male  PRE-OPERATIVE DIAGNOSIS:  Stenosis L3-4, L4-5  POST-OPERATIVE DIAGNOSIS:  Stenosis L3-4, L4-5  PROCEDURE:  Procedure(s) with comments: Microlumbar decompression Lumbar three-four, Lumbar four-five (N/A) - 3 C-Bed  SURGEON:  Surgeon(s) and Role:    Susa Day, MD - Primary  PHYSICIAN ASSISTANT:   ASSISTANTS: [place]   ANESTHESIA:   general  EBL:  50 mL   BLOOD ADMINISTERED:none  DRAINS: none   LOCAL MEDICATIONS USED:  MARCAINE     SPECIMEN:  No Specimen  DISPOSITION OF SPECIMEN:  N/A  COUNTS:  YES  TOURNIQUET:  * No tourniquets in log *  DICTATION: .Other Dictation: Dictation Number [id number]  PLAN OF CARE: Admit for overnight observation  PATIENT DISPOSITION:  PACU - hemodynamically stable.   Delay start of Pharmacological VTE agent (>24hrs) due to surgical blood loss or risk of bleeding: yes

## 2022-04-17 NOTE — Anesthesia Postprocedure Evaluation (Signed)
Anesthesia Post Note  Patient: Ivan [name]  Procedure(s) Performed: Microlumbar decompression Lumbar three-four, Lumbar four-five (Spine Lumbar)     Patient location during evaluation: PACU Anesthesia Type: General Level of consciousness: awake and alert Pain management: pain level controlled Vital Signs Assessment: post-procedure vital signs reviewed and stable Respiratory status: spontaneous breathing, nonlabored ventilation and respiratory function stable Cardiovascular status: blood pressure returned to baseline and stable Postop Assessment: no apparent nausea or vomiting Anesthetic complications: no   No notable events documented.  Last Vitals:  Vitals:   04/17/22 1615 04/17/22 1635  BP: (!) 138/56 (!) 142/64  Pulse: 61 (!) 56  Resp: 16 18  Temp: 36.5 C   SpO2: 99% 100%    Last Pain:  Vitals:   04/17/22 1615  TempSrc:   PainSc: 4                  Aqueelah Cotrell,W. EDMOND

## 2022-04-17 NOTE — Transfer of Care (Signed)
Immediate Anesthesia Transfer of Care Note  Patient: Ivan [name]  Procedure(s) Performed: Microlumbar decompression Lumbar three-four, Lumbar four-five (Spine Lumbar)  Patient Location: PACU  Anesthesia Type:General  Level of Consciousness: awake  Airway & Oxygen Therapy: Patient Spontanous Breathing and Patient connected to nasal cannula oxygen  Post-op Assessment: Report given to RN, Post -op Vital signs reviewed and stable and Patient moving all extremities X 4  Post vital signs: Reviewed and stable  Last Vitals:  Vitals Value Taken Time  BP 126/98 04/17/22 1546  Temp    Pulse 61 04/17/22 1547  Resp 17 04/17/22 1547  SpO2 100 % 04/17/22 1547  Vitals shown include unvalidated device data.  Last Pain:  Vitals:   04/17/22 1109  TempSrc:   PainSc: 0-No pain         Complications: No notable events documented.

## 2022-04-18 ENCOUNTER — Encounter (HOSPITAL_COMMUNITY): Payer: Self-pay | Admitting: Specialist

## 2022-04-18 DIAGNOSIS — M48062 Spinal stenosis, lumbar region with neurogenic claudication: Secondary | ICD-10-CM | POA: Diagnosis not present

## 2022-04-18 LAB — GLUCOSE, CAPILLARY: Glucose-Capillary: 184 mg/dL — ABNORMAL HIGH (ref 70–99)

## 2022-04-18 LAB — COMPREHENSIVE METABOLIC PANEL
ALT: 13 U/L (ref 0–44)
AST: 21 U/L (ref 15–41)
Albumin: 3.9 g/dL (ref 3.5–5.0)
Alkaline Phosphatase: 58 U/L (ref 38–126)
Anion gap: 12 (ref 5–15)
BUN: 33 mg/dL — ABNORMAL HIGH (ref 8–23)
CO2: 22 mmol/L (ref 22–32)
Calcium: 9.2 mg/dL (ref 8.9–10.3)
Chloride: 97 mmol/L — ABNORMAL LOW (ref 98–111)
Creatinine, Ser: 1.71 mg/dL — ABNORMAL HIGH (ref 0.61–1.24)
GFR, Estimated: 39 mL/min — ABNORMAL LOW (ref >60)
Glucose, Bld: 173 mg/dL — ABNORMAL HIGH (ref 70–99)
Potassium: 3.4 mmol/L — ABNORMAL LOW (ref 3.5–5.1)
Sodium: 131 mmol/L — ABNORMAL LOW (ref 135–145)
Total Bilirubin: 0.8 mg/dL (ref 0.3–1.2)
Total Protein: 6.4 g/dL — ABNORMAL LOW (ref 6.5–8.1)

## 2022-04-18 LAB — CBC
HCT: 38.5 % — ABNORMAL LOW (ref 39.0–52.0)
Hemoglobin: 13.7 g/dL (ref 13.0–17.0)
MCH: 30.1 pg (ref 26.0–34.0)
MCHC: 35.6 g/dL (ref 30.0–36.0)
MCV: 84.6 fL (ref 80.0–100.0)
Platelets: 193 10*3/uL (ref 150–400)
RBC: 4.55 MIL/uL (ref 4.22–5.81)
RDW: 13.3 % (ref 11.5–15.5)
WBC: 12.6 10*3/uL — ABNORMAL HIGH (ref 4.0–10.5)
nRBC: 0 % (ref 0.0–0.2)

## 2022-04-18 NOTE — Progress Notes (Signed)
Patient alert and oriented, mae's well, voiding adequate amount of urine, swallowing without difficulty, no c/o pain at time of discharge. Patient discharged home with family. Script and discharged instructions given to patient. Patient and family stated understanding of instructions given. Patient has an appointment with Dr. Beane in 2 weeks 

## 2022-04-18 NOTE — Progress Notes (Signed)
Subjective: 1 Day Post-Op Procedure(s) (LRB): Microlumbar decompression Lumbar three-four, Lumbar four-five (N/A) Patient seen in rounds for Dr. [name] Patient reports pain as mild.  Pain is really located at surgical site Patient is doing wonderful, he is very pleased with how he is feeling today He was meeting with physical therapist at time of exam   Objective: Vital signs in last 24 hours: Temp:  [97.5 F (36.4 C)-98.9 F (37.2 C)] 98.1 F (36.7 C) (06/03 0747) Pulse Rate:  [56-81] 70 (06/03 0747) Resp:  [10-20] 17 (06/03 0747) BP: (125-148)/(54-98) 129/73 (06/03 0747) SpO2:  [95 %-100 %] 99 % (06/03 0747) Weight:  [90.7 kg] 90.7 kg (06/02 1048)  Intake/Output from previous day: 06/02 0701 - 06/03 0700 In: 1300 [I.V.:1000; IV Piggyback:300] Out: 2075 [Urine:2025; Blood:50] Intake/Output this shift: No intake/output data recorded.  Recent Labs    04/18/22 0642  HGB 13.7   Recent Labs    04/18/22 0642  WBC 12.6*  RBC 4.55  HCT 38.5*  PLT 193   Recent Labs    04/18/22 0642  NA 131*  K 3.4*  CL 97*  CO2 22  BUN 33*  CREATININE 1.71*  GLUCOSE 173*  CALCIUM 9.2   No results for input(s): LABPT, INR in the last 72 hours.  Neurologically intact Neurovascular intact Sensation intact distally Intact pulses distally Dorsiflexion/Plantar flexion intact Incision: dressing C/D/I Compartment soft   Assessment/Plan: 1 Day Post-Op Procedure(s) (LRB): Microlumbar decompression Lumbar three-four, Lumbar four-five (N/A) Patient is doing great, ready to be discharged today Once cleared by PT he is okay to be discharged Medications of already been sent to the pharmacy He will follow-up in the office with Dr. Maxie Better in 10 to 14 days.    Nettie Elm [facility] [phone] 04/18/2022, 9:05 AM

## 2022-04-18 NOTE — Evaluation (Signed)
Occupational Therapy Evaluation Patient Details Name: Ivan [name] MRN: [id number] DOB: 1936-07-18 Today's Date: 04/18/2022   History of Present Illness Pt is an 86 y/o M presenting for L3-4, L4-5 decompresssion and foraminectomies. PMH includes cancer, CKD, DM, HLD, and HTN   Clinical Impression   Pt requiring assistance at baseline with ADLs and was using RW for mobility. Pt lives with wife, who was providing assistance for bathing and dressing, and can provide assist at d/c. Pt currently min  guard-max A for ADLs, min guard for bed mobility and transfers with RW. Pt educated on compensatory strategies for dressing, bathing, grooming, bed mobility, along with precautions. Pt verbalized understanding and adheres well to precautions during session. Pt presenting with impairments listed below, will follow acutely. Recommend HHOT at d/c.     Recommendations for follow up therapy are one component of a multi-disciplinary discharge planning process, led by the attending physician.  Recommendations may be updated based on patient status, additional functional criteria and insurance authorization.   Follow Up Recommendations  Home health OT    Assistance Recommended at Discharge Set up Supervision/Assistance  Patient can return home with the following A little help with walking and/or transfers;A little help with bathing/dressing/bathroom;Assistance with cooking/housework;Help with stairs or ramp for entrance;Assist for transportation    Functional Status Assessment  Patient has had a recent decline in their functional status and demonstrates the ability to make significant improvements in function in a reasonable and predictable amount of time.  Equipment Recommendations  None recommended by OT;Other (comment) (pt has all needed DME)    Recommendations for Other Services PT consult     Precautions / Restrictions Precautions Precautions: Back Precaution Booklet Issued: Yes  (comment) Precaution Comments: educated pt on 3/3 back precautions Required Braces or Orthoses: Other Brace (no brace needed per MD) Restrictions Weight Bearing Restrictions: No      Mobility Bed Mobility Overal bed mobility: Needs Assistance Bed Mobility: Sidelying to Sit, Sit to Sidelying   Sidelying to sit: Min guard     Sit to sidelying: Min guard General bed mobility comments: cues for log rolling technique    Transfers Overall transfer level: Needs assistance Equipment used: Rolling walker (2 wheels) Transfers: Sit to/from Stand Sit to Stand: Min guard                  Balance Overall balance assessment: Needs assistance Sitting-balance support: Feet supported, Bilateral upper extremity supported Sitting balance-Leahy Scale: Fair Sitting balance - Comments: unable to reach outside BOS   Standing balance support: During functional activity, Reliant on assistive device for balance Standing balance-Leahy Scale: Fair Standing balance comment: stands statically at sink without LOB                           ADL either performed or assessed with clinical judgement   ADL Overall ADL's : Needs assistance/impaired Eating/Feeding: Modified independent;Sitting   Grooming: Modified independent;Standing;Sitting Grooming Details (indicate cue type and reason): to brush teeth Upper Body Bathing: Min guard;Sitting   Lower Body Bathing: Maximal assistance;Sitting/lateral leans   Upper Body Dressing : Min guard;Sitting   Lower Body Dressing: Maximal assistance;Sitting/lateral leans;Sit to/from stand   Toilet Transfer: Min guard;Rolling walker (2 wheels);Ambulation;Regular Toilet   Toileting- Clothing Manipulation and Hygiene: Minimal assistance;Sitting/lateral lean Toileting - Clothing Manipulation Details (indicate cue type and reason): for clothing mgmt     Functional mobility during ADLs: Min guard;Rolling walker (2 wheels)  Vision Baseline  Vision/History: 1 Wears glasses Vision Assessment?: No apparent visual deficits     Perception     Praxis      Pertinent Vitals/Pain Pain Assessment Pain Assessment: Faces Pain Score: 1  Faces Pain Scale: Hurts a little bit Pain Location: low back/incision Pain Descriptors / Indicators: Discomfort, Guarding Pain Intervention(s): Limited activity within patient's tolerance, Monitored during session     Hand Dominance     Extremity/Trunk Assessment Upper Extremity Assessment Upper Extremity Assessment: Overall WFL for tasks assessed   Lower Extremity Assessment Lower Extremity Assessment: Defer to PT evaluation   Cervical / Trunk Assessment Cervical / Trunk Assessment: Back Surgery   Communication Communication Communication: No difficulties   Cognition Arousal/Alertness: Awake/alert Behavior During Therapy: WFL for tasks assessed/performed Overall Cognitive Status: Within Functional Limits for tasks assessed                                       General Comments  wife present and helpful during session    Exercises     Shoulder Instructions      Home Living Family/patient expects to be discharged to:: Private residence Living Arrangements: Spouse/significant other Available Help at Discharge: Family Type of Home: House Home Access: Level entry     Home Layout: One level     Bathroom Shower/Tub: Occupational psychologist: Handicapped height Bathroom Accessibility: Yes How Accessible: Accessible via walker Home Equipment: Conservation officer, nature (2 wheels);Shower seat;Shower seat - built in          Prior Functioning/Environment Prior Level of Function : Needs assist       Physical Assist : Mobility (physical);ADLs (physical)     Mobility Comments: RW use ADLs Comments: wife assisted with LB ADLs/bathing        OT Problem List: Decreased strength;Decreased range of motion;Decreased activity tolerance;Impaired balance (sitting  and/or standing)      OT Treatment/Interventions: Self-care/ADL training;Therapeutic exercise;DME and/or AE instruction;Energy conservation;Therapeutic activities;Patient/family education;Balance training    OT Goals(Current goals can be found in the care plan section) Acute Rehab OT Goals Patient Stated Goal: none stated OT Goal Formulation: With patient Time For Goal Achievement: 05/02/22 Potential to Achieve Goals: Good ADL Goals Pt Will Perform Lower Body Bathing: with min guard assist;sit to/from stand Pt Will Perform Lower Body Dressing: with min guard assist;sitting/lateral leans;sit to/from stand Pt Will Transfer to Toilet: with modified independence;regular height toilet;ambulating Pt Will Perform Tub/Shower Transfer: shower seat;ambulating;rolling walker;Shower transfer;with min guard assist  OT Frequency: Min 2X/week    Co-evaluation              AM-PAC OT "6 Clicks" Daily Activity     Outcome Measure Help from another person eating meals?: None Help from another person taking care of personal grooming?: A Little Help from another person toileting, which includes using toliet, bedpan, or urinal?: A Little Help from another person bathing (including washing, rinsing, drying)?: A Lot Help from another person to put on and taking off regular upper body clothing?: A Little Help from another person to put on and taking off regular lower body clothing?: A Lot 6 Click Score: 17   End of Session Equipment Utilized During Treatment: Gait belt;Rolling walker (2 wheels) Nurse Communication: Mobility status  Activity Tolerance: Patient tolerated treatment well Patient left: in bed;with call bell/phone within reach;with family/visitor present (sitting EOB)  OT Visit Diagnosis: Unsteadiness on feet (R26.81);Other  abnormalities of gait and mobility (R26.89);Muscle weakness (generalized) (M62.81)                Time: 9735-3299 OT Time Calculation (min): 29 min Charges:  OT  General Charges $OT Visit: 1 Visit OT Evaluation $OT Eval Low Complexity: 1 Low OT Treatments $Self Care/Home Management : 8-22 mins  Lynnda Child, OTD, OTR/L Acute Rehab [phone]) [phone] - [place] 04/18/2022, 8:24 AM

## 2022-04-18 NOTE — Op Note (Signed)
NAMETILDON, Ivan MEDICAL RECORD NO: [id number] ACCOUNT NO: [id number] DATE OF BIRTH: Mar 05, 1936 FACILITY: [facility] LOCATION: [facility] PHYSICIAN: Johnn Hai, MD  Operative Report   DATE OF PROCEDURE: 04/17/2022   PREOPERATIVE DIAGNOSIS:  Spinal stenosis L3-L4 and L4-L5.  POSTOPERATIVE DIAGNOSIS:  Spinal stenosis L3-L4 and L4-L5.   PROCEDURE PERFORMED:   1.  Microlumbar decompression bilaterally at L3-L4 and L4-L5. 2.  Foraminotomies L3, L4, L5 bilaterally.  ANESTHESIA:  General.  ASSISTANT:  Lacie Draft, PA.  HISTORY:  An 86 year old with bilateral lower extremity radicular pain, numbness and tingling in the L5, S1 and L4 nerve root distribution.  He had severe stenosis at L3-L4, moderately severe at L4-L5.  He was indicated for microlumbar decompression  L3-L4 and L4-L5.  Risks and benefits discussed including bleeding, infection, damage to neurovascular structures, no change in symptoms, worsening symptoms, DVT, PE, anesthetic complications, etc.  DESCRIPTION OF PROCEDURE:  With the patient in supine position.  After induction of adequate general anesthesia, 2 grams Kefzol was placed prone on the Wilson frame.  All bony prominences were well padded.  Lumbar region was prepped and draped in the  usual sterile fashion.  Two 18-gauge spinal needle was utilized to localize the L3-L4, L4-L5 interspace, confirmed with x-ray.  Incision was made from the spinous process of above 3 to just below 5.  Subcutaneous tissue was dissected.  Electrocautery was  utilized to achieve hemostasis.  Dorsal lumbar fascia divided in line with skin incision.  Paraspinous muscle elevated from lamina of L3-L4, L4-L5.  McCulloch retractor was placed.  Operating microscope was draped and brought in the surgical field.   Confirmatory radiographs obtained.  Leksell rongeur utilized to remove the spinous processes of L3 and L4.  Leksell was utilized to remove the inferior process and lamina of 3 and 4 partially.   I then used a 2 mm Kerrison centrally, began the  decompression cephalad to the point detaching the ligamentum flavum.  I then began sequential removal of the ligamentum flavum utilizing a Woodson retractor to protect the neural elements.  A plane between the ligamentum flavum and the epidural fat.   Severe hypertrophic ligamentum was noted bilaterally.  I decompressed both lateral recesses to the medial border of the pedicle.  Undercutting the facets bilaterally.  I performed foraminotomies of L3 and of L4.  There was no disk herniation noted.   Bipolar electrocautery was utilized to achieve hemostasis.  Following this portion confirmatory radiograph obtained with [facility] retractors and the foramen of 3 and of 4 and a Penfield at L4-L5.  Next, there was good restoration of the thecal sac.  Bone  wax was placed on the cancellous surfaces.  No evidence of CSF leakage or active bleeding.  I then performed hemilaminotomies of the caudad edge of L4 with a Leksell rongeur and a curette was utilized to detach ligamentum flavum from the cephalad edge of  L5.  Ligamentum flavum was removed from the interspace again centrally first, cephalad to detach ligamentum flavum from the caudad edge of 4.  I then removed the ligamentum flavum bilaterally utilizing a Woodson retractor protecting the neural elements.   Decompressed the lateral recess to the medial border of the pedicle.  Severe hypertrophic ligamentum flavum was noted bilaterally.  I performed foraminotomies of L5 as well.  Following this, a Woodson retractor passed freely out the foramen at 3 and 4  bilaterally.  Bipolar cautery was utilized to achieve hemostasis.  Good restoration of the thecal sac was noted.  Bone wax was placed in the cancellous surfaces.  We used antibiotic irrigation.  Following this, I inspected no evidence of CSF leakage or  active bleeding.  Good restoration of the thecal sac at both levels.  Next, I removed the McCulloch retractor,  irrigated the paraspinous musculature.  I used bipolar cautery to achieve hemostasis of any bleeding, which was minimal.  I used thrombin-soaked Gelfoam in the laminotomy defects and then removed the  thrombin-soaked Gelfoam.  He had no active bleeding noted.  I repaired the dorsal lumbar fascia with 1 Vicryl interrupted figure-of-eight sutures and left a 1 cm aperture caudad for any drainage that may occur.  Copiously irrigated the subcutaneous  tissue and then closed it with 2-0 and skin with staples.  Wound was dressed sterilely, placed supine on the hospital bed, extubated without difficulty and transported to the recovery room in satisfactory condition.   The patient tolerated the procedure well.  No complications.  Assistant, Lacie Draft, PA used throughout the case for patient positioning, general intermittent neural retraction, closure.  ESTIMATED BLOOD LOSS:  50 mL.     SUJ D: 04/17/2022 3:40:39 pm T: 04/18/2022 12:40:00 am  JOB: [id number]/ [id number]

## 2022-04-18 NOTE — Plan of Care (Signed)
  Problem: Tissue Perfusion: Goal: Adequacy of tissue perfusion will improve Outcome: Completed/Met   Problem: Skin Integrity: Goal: Risk for impaired skin integrity will decrease Outcome: Completed/Met   Problem: Nutritional: Goal: Maintenance of adequate nutrition will improve Outcome: Completed/Met Goal: Progress toward achieving an optimal weight will improve Outcome: Completed/Met   Problem: Metabolic: Goal: Ability to maintain appropriate glucose levels will improve Outcome: Completed/Met   Problem: Health Behavior/Discharge Planning: Goal: Ability to identify and utilize available resources and services will improve Outcome: Completed/Met Goal: Ability to manage health-related needs will improve Outcome: Completed/Met   Problem: Coping: Goal: Ability to adjust to condition or change in health will improve Outcome: Completed/Met   Problem: Education: Goal: Individualized Educational Video(s) Outcome: Completed/Met   Problem: Education: Goal: Ability to describe self-care measures that may prevent or decrease complications (Diabetes Survival Skills Education) will improve Outcome: Completed/Met Goal: Individualized Educational Video(s) Outcome: Completed/Met

## 2022-04-18 NOTE — Evaluation (Signed)
Physical Therapy Evaluation Patient Details Name: Ivan [name] MRN: [id number] DOB: Sep 19, 1936 Today's Date: 04/18/2022  History of Present Illness  Pt is an 86 y/o M presenting for L3-4, L4-5 decompresssion and foraminectomies. PMH includes cancer, CKD, DM, HLD, and HTN  Clinical Impression  Patient admitted following above procedure. PTA, patient lives with wife who assists with ADLs and uses RW for mobility due to L LE weakness. Patient functioning at supervision level with use of Rw for mobility. Educated patient on back precautions, activity progression, and car transfer, patient verbalized understanding. No further skilled PT needs identified acutely. Recommend HHPT at discharge to maximize functional independence and safety.        Recommendations for follow up therapy are one component of a multi-disciplinary discharge planning process, led by the attending physician.  Recommendations may be updated based on patient status, additional functional criteria and insurance authorization.  Follow Up Recommendations Home health PT    Assistance Recommended at Discharge Frequent or constant Supervision/Assistance  Patient can return home with the following       Equipment Recommendations None recommended by PT  Recommendations for Other Services       Functional Status Assessment Patient has had a recent decline in their functional status and demonstrates the ability to make significant improvements in function in a reasonable and predictable amount of time.     Precautions / Restrictions Precautions Precautions: Back Precaution Booklet Issued: Yes (comment) Precaution Comments: educated pt on 3/3 back precautions Required Braces or Orthoses: Other Brace (no brace needed per MD) Restrictions Weight Bearing Restrictions: No      Mobility  Bed Mobility               General bed mobility comments: verbally reviewed log roll technique. Sitting EOB on arrival     Transfers Overall transfer level: Needs assistance Equipment used: Rolling Ivan [name] (2 wheels) Transfers: Sit to/from Stand Sit to Stand: Supervision                Ambulation/Gait Ambulation/Gait assistance: Supervision Gait Distance (Feet): 300 Feet Assistive device: Rolling Ivan [name] (2 wheels) Gait Pattern/deviations: Step-through pattern, Decreased stride length, Knee hyperextension - left Gait velocity: decreased     General Gait Details: supervision for safety. Knee hyperextension on L during stance with ambulation due to weakness  Stairs            Wheelchair Mobility    Modified Rankin (Stroke Patients Only)       Balance Overall balance assessment: Needs assistance Sitting-balance support: Feet supported, Bilateral upper extremity supported Sitting balance-Ivan [name]: Fair     Standing balance support: During functional activity, Reliant on assistive device for balance Standing balance-Ivan [name]: Fair                               Pertinent Vitals/Pain Pain Assessment Pain Assessment: Faces Faces Pain [name]: Hurts a little bit Pain Location: low back/incision Pain Descriptors / Indicators: Discomfort, Guarding Pain Intervention(s): Monitored during session    Home Living Family/patient expects to be discharged to:: Private residence Living Arrangements: Spouse/significant other Available Help at Discharge: Family Type of Home: House Home Access: Level entry       Home Layout: One level Home Equipment: Conservation officer, nature (2 wheels);Shower seat;Shower seat - built in      Prior Function Prior Level of Function : Needs assist       Physical Assist : Mobility (physical);ADLs (physical)  Mobility Comments: RW use ADLs Comments: wife assisted with LB ADLs/bathing     Hand Dominance        Extremity/Trunk Assessment   Upper Extremity Assessment Upper Extremity Assessment: Defer to OT evaluation    Lower  Extremity Assessment Lower Extremity Assessment: LLE deficits/detail LLE Deficits / Details: grossly 3-/5    Cervical / Trunk Assessment Cervical / Trunk Assessment: Back Surgery  Communication   Communication: No difficulties  Cognition Arousal/Alertness: Awake/alert Behavior During Therapy: WFL for tasks assessed/performed Overall Cognitive Status: Within Functional Limits for tasks assessed                                          General Comments General comments (skin integrity, edema, etc.): wife present and helpful during session    Exercises     Assessment/Plan    PT Assessment Patient does not need any further PT services  PT Problem List         PT Treatment Interventions      PT Goals (Current goals can be found in the Care Plan section)  Acute Rehab PT Goals Patient Stated Goal: to get off the Ivan [name] PT Goal Formulation: All assessment and education complete, DC therapy    Frequency       Co-evaluation               AM-PAC PT "6 Clicks" Mobility  Outcome Measure Help needed turning from your back to your side while in a flat bed without using bedrails?: A Little Help needed moving from lying on your back to sitting on the side of a flat bed without using bedrails?: A Little Help needed moving to and from a bed to a chair (including a wheelchair)?: A Little Help needed standing up from a chair using your arms (e.g., wheelchair or bedside chair)?: A Little Help needed to walk in hospital room?: A Little Help needed climbing 3-5 steps with a railing? : A Little 6 Click Score: 18    End of Session   Activity Tolerance: Patient tolerated treatment well Patient left: in bed;with call bell/phone within reach;with family/visitor present Nurse Communication: Mobility status PT Visit Diagnosis: Muscle weakness (generalized) (M62.81);Unsteadiness on feet (R26.81)    Time: 3154-0086 PT Time Calculation (min) (ACUTE ONLY): 19  min   Charges:   PT Evaluation $PT Eval Low Complexity: 1 Low          Ivan [name] PT, DPT Acute Rehabilitation Services Pager [phone] Office [phone]   Ivan [name] 04/18/2022, 9:06 AM

## 2022-04-18 NOTE — Care Management (Signed)
Referral accepted by [facility] services.  Unit staff to supply any needed DME, patient states he has a RW at home

## 2022-04-30 ENCOUNTER — Other Ambulatory Visit: Payer: Self-pay

## 2022-04-30 DIAGNOSIS — Z86718 Personal history of other venous thrombosis and embolism: Secondary | ICD-10-CM

## 2022-04-30 DIAGNOSIS — Z95828 Presence of other vascular implants and grafts: Secondary | ICD-10-CM

## 2022-05-08 ENCOUNTER — Other Ambulatory Visit: Payer: Self-pay

## 2022-05-08 ENCOUNTER — Ambulatory Visit (HOSPITAL_COMMUNITY)
Admission: RE | Admit: 2022-05-08 | Discharge: 2022-05-08 | Disposition: A | Discharge status: Home / Self Care | Payer: Medicare HMO | Source: Ambulatory Visit | Attending: Vascular Surgery | Admitting: Vascular Surgery

## 2022-05-08 ENCOUNTER — Encounter (HOSPITAL_COMMUNITY)
Admission: RE | Disposition: A | Discharge status: Home / Self Care | Payer: Self-pay | Source: Ambulatory Visit | Attending: Vascular Surgery

## 2022-05-08 DIAGNOSIS — Z95828 Presence of other vascular implants and grafts: Secondary | ICD-10-CM

## 2022-05-08 DIAGNOSIS — I129 Hypertensive chronic kidney disease with stage 1 through stage 4 chronic kidney disease, or unspecified chronic kidney disease: Secondary | ICD-10-CM | POA: Diagnosis not present

## 2022-05-08 DIAGNOSIS — M4800 Spinal stenosis, site unspecified: Secondary | ICD-10-CM | POA: Diagnosis not present

## 2022-05-08 DIAGNOSIS — Z86718 Personal history of other venous thrombosis and embolism: Secondary | ICD-10-CM | POA: Diagnosis not present

## 2022-05-08 DIAGNOSIS — N189 Chronic kidney disease, unspecified: Secondary | ICD-10-CM | POA: Insufficient documentation

## 2022-05-08 DIAGNOSIS — E1122 Type 2 diabetes mellitus with diabetic chronic kidney disease: Secondary | ICD-10-CM | POA: Diagnosis not present

## 2022-05-08 DIAGNOSIS — Z4589 Encounter for adjustment and management of other implanted devices: Secondary | ICD-10-CM | POA: Insufficient documentation

## 2022-05-08 DIAGNOSIS — Z86711 Personal history of pulmonary embolism: Secondary | ICD-10-CM | POA: Insufficient documentation

## 2022-05-08 HISTORY — PX: IVC FILTER REMOVAL: CATH118246

## 2022-05-08 LAB — POCT I-STAT, CHEM 8
BUN: 39 mg/dL — ABNORMAL HIGH (ref 8–23)
Calcium, Ion: 1.07 mmol/L — ABNORMAL LOW (ref 1.15–1.40)
Chloride: 95 mmol/L — ABNORMAL LOW (ref 98–111)
Creatinine, Ser: 1.6 mg/dL — ABNORMAL HIGH (ref 0.61–1.24)
Glucose, Bld: 149 mg/dL — ABNORMAL HIGH (ref 70–99)
HCT: 40 % (ref 39.0–52.0)
Hemoglobin: 13.6 g/dL (ref 13.0–17.0)
Potassium: 3.9 mmol/L (ref 3.5–5.1)
Sodium: 127 mmol/L — ABNORMAL LOW (ref 135–145)
TCO2: 22 mmol/L (ref 22–32)

## 2022-05-08 LAB — GLUCOSE, CAPILLARY: Glucose-Capillary: 159 mg/dL — ABNORMAL HIGH (ref 70–99)

## 2022-05-08 SURGERY — IVC FILTER REMOVAL
Anesthesia: LOCAL

## 2022-05-08 MED ORDER — MIDAZOLAM HCL 2 MG/2ML IJ SOLN
INTRAMUSCULAR | Status: DC | PRN
  Administered 2022-05-08: 1 mg via INTRAVENOUS

## 2022-05-08 MED ORDER — IODIXANOL 320 MG/ML IV SOLN
INTRAVENOUS | Status: DC | PRN
  Administered 2022-05-08: 20 mL via INTRAVENOUS

## 2022-05-08 MED ORDER — HEPARIN (PORCINE) IN NACL 1000-0.9 UT/500ML-% IV SOLN
INTRAVENOUS | Status: AC
  Filled 2022-05-08: qty 1000

## 2022-05-08 MED ORDER — MIDAZOLAM HCL 2 MG/2ML IJ SOLN
INTRAMUSCULAR | Status: AC
  Filled 2022-05-08: qty 2

## 2022-05-08 MED ORDER — ONDANSETRON HCL 4 MG/2ML IJ SOLN: 4.0000 mg | Freq: Four times a day (QID) | INTRAMUSCULAR | Status: DC | PRN

## 2022-05-08 MED ORDER — SODIUM CHLORIDE 0.9 % IV SOLN: INTRAVENOUS | Status: DC

## 2022-05-08 MED ORDER — ACETAMINOPHEN 325 MG PO TABS: 650.0000 mg | ORAL_TABLET | ORAL | Status: DC | PRN

## 2022-05-08 MED ORDER — LABETALOL HCL 5 MG/ML IV SOLN: 10.0000 mg | INTRAVENOUS | Status: DC | PRN

## 2022-05-08 MED ORDER — HYDRALAZINE HCL 20 MG/ML IJ SOLN: 5.0000 mg | INTRAMUSCULAR | Status: DC | PRN

## 2022-05-08 MED ORDER — SODIUM CHLORIDE 0.9 % IV SOLN: 250.0000 mL | INTRAVENOUS | Status: DC | PRN

## 2022-05-08 MED ORDER — FENTANYL CITRATE (PF) 100 MCG/2ML IJ SOLN
INTRAMUSCULAR | Status: AC
  Filled 2022-05-08: qty 2

## 2022-05-08 MED ORDER — SODIUM CHLORIDE 0.9 % WEIGHT BASED INFUSION: 1.0000 mL/kg/h | INTRAVENOUS | Status: DC

## 2022-05-08 MED ORDER — SODIUM CHLORIDE 0.9% FLUSH: 3.0000 mL | INTRAVENOUS | Status: DC | PRN

## 2022-05-08 MED ORDER — FENTANYL CITRATE (PF) 100 MCG/2ML IJ SOLN
INTRAMUSCULAR | Status: DC | PRN
  Administered 2022-05-08: 50 ug via INTRAVENOUS

## 2022-05-08 MED ORDER — LIDOCAINE HCL (PF) 1 % IJ SOLN
INTRAMUSCULAR | Status: AC
  Filled 2022-05-08: qty 30

## 2022-05-08 MED ORDER — HEPARIN (PORCINE) IN NACL 1000-0.9 UT/500ML-% IV SOLN
INTRAVENOUS | Status: DC | PRN
  Administered 2022-05-08: 500 mL

## 2022-05-08 MED ORDER — SODIUM CHLORIDE 0.9% FLUSH: 3.0000 mL | Freq: Two times a day (BID) | INTRAVENOUS | Status: DC

## 2022-05-08 MED ORDER — LIDOCAINE HCL (PF) 1 % IJ SOLN
INTRAMUSCULAR | Status: DC | PRN
  Administered 2022-05-08: 20 mL via INTRADERMAL

## 2022-05-08 SURGICAL SUPPLY — 12 items
CATH ANGIO 5F BER2 65CM (CATHETERS) ×1 IMPLANT
DRYSEAL FLEXSHEATH 15FR 33CM (SHEATH) ×1
KIT MICROPUNCTURE NIT STIFF (SHEATH) ×1 IMPLANT
KIT PV (KITS) ×2 IMPLANT
SET CLOVERSNARE FLT RETRIEVAL (MISCELLANEOUS) ×1 IMPLANT
SHEATH DRYSEAL FLEX 15FR 33CM (SHEATH) IMPLANT
SHEATH PINNACLE 6F 10CM (SHEATH) ×1 IMPLANT
SHEATH PROBE COVER 6X72 (BAG) ×1 IMPLANT
TRANSDUCER W/STOPCOCK (MISCELLANEOUS) ×2 IMPLANT
TRAY PV CATH (CUSTOM PROCEDURE TRAY) ×3 IMPLANT
WIRE BENTSON .035X145CM (WIRE) ×1 IMPLANT
WIRE ROSEN-J .035X260CM (WIRE) ×1 IMPLANT

## 2022-05-09 ENCOUNTER — Encounter (HOSPITAL_COMMUNITY): Payer: Self-pay | Admitting: Vascular Surgery

## 2022-05-11 ENCOUNTER — Telehealth: Payer: Self-pay

## 2022-05-25 ENCOUNTER — Ambulatory Visit: Payer: Medicare HMO | Admitting: Cardiology

## 2022-05-25 NOTE — Progress Notes (Unsigned)
[facility] [place]  ASSESSMENT / PLAN: 86 y.o. male with satisfactory removal of temporary inferior vena cava filter.  Suture removed today in the office.  He may follow-up with me as needed.  CHIEF COMPLAINT: History of DVT/PE.  Plans for back surgery  HISTORY OF PRESENT ILLNESS: Ivan [name] is a 86 y.o. male with left lower extremity weakness from spinal stenosis.  He is planned to undergo surgical correction with Dr. Maxie Better in the near future.  He has a history of DVT and PE after orthopedic surgery in the past.  He was treated successfully with anticoagulation.  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  I was asked to evaluate the patient to consider temporary inferior vena cava filter placement.  I think the patient is a good candidate for this.  I explained the rationale, risks, benefits, and alternatives to IVC filter placement.  The patient is understanding and amenable with proceeding.  05/26/22: Patient returns to clinic after IVC filter retrieval.  This was uneventful.  He has recovered well  Past Medical History:  Diagnosis Date   Cancer ([place])    Chronic kidney disease    Diabetes mellitus without complication ([place])    Heart murmur    moderate AS 01/13/22   Hx pulmonary embolism 2012   Left leg DVT-PE (presumably was postop) -> Per patient and wife, he was given 10 days of injections but not placed on DOAC or warfarin; only on aspirin 81 mg   Hyperlipidemia    Hypertension    Lower leg DVT (deep venous thrombosis) ([place]) 2012   -> Noted to have chronic left femoral vein DVT on LEV Dopplers January 2023    Past Surgical History:  Procedure Laterality Date   IVC FILTER INSERTION N/A 12/26/2021   Procedure: IVC FILTER INSERTION;  Surgeon: Cherre Robins, MD;  Location: [facility];  Service: Cardiovascular;  Laterality: N/A;   IVC FILTER REMOVAL N/A 05/08/2022   Procedure: IVC FILTER REMOVAL;  Surgeon: Cherre Robins, MD;  Location: [facility];  Service: Cardiovascular;  Laterality: N/A;   IVC VENOGRAPHY N/A 12/26/2021   Procedure: IVC Venography;  Surgeon: Cherre Robins, MD;  Location: [facility];  Service: Cardiovascular;  Laterality: N/A;   LUMBAR LAMINECTOMY/DECOMPRESSION MICRODISCECTOMY N/A 04/17/2022   Procedure: Microlumbar decompression Lumbar three-four, Lumbar four-five;  Surgeon: Susa Day, MD;  Location: [facility];  Service: Orthopedics;  Laterality: N/A;  3 C-Bed   PROSTATE SURGERY N/A    REPLACEMENT TOTAL KNEE Bilateral     Family History  Family history unknown: Yes    Social History   Socioeconomic History   Marital status: Married    Spouse name: Not on file   Number of children: Not on file   Years of education: Not on file   Highest education level: Not on file  Occupational History   Not on file  Tobacco Use   Smoking status: Never   Smokeless tobacco: Never  Vaping Use   Vaping Use: Never used  Substance and Sexual Activity   Alcohol use: Not Currently   Drug use: Never   Sexual activity: Not on file  Other Topics Concern   Not on file  Social History Narrative   Has become somewhat immobilized because of his spinal stenosis, but usually tries to walk 1 to 2 miles a day even with walker.   Social Determinants of Health  Financial Resource Strain: Not on file  Food Insecurity: Not on file  Transportation Needs: Not on file  Physical Activity: Not on file  Stress: Not on file  Social Connections: Not on file  Intimate Partner Violence: Not on file    Allergies  Allergen Reactions   Oxycontin [Oxycodone] Other (See Comments)    hallucinations    Current Outpatient Medications  Medication Sig Dispense Refill   acetaminophen (TYLENOL) 500 MG tablet Take 1,000 mg by mouth every 8 (eight) hours as needed for moderate pain.     amLODipine (NORVASC) 10 MG tablet Take 10 mg by mouth in the morning.     Ascorbic Acid (VITAMIN C) 1000  MG tablet Take 1,000 mg by mouth in the morning.     aspirin EC 81 MG tablet Take 81 mg by mouth daily. Swallow whole.     atorvastatin (LIPITOR) 20 MG tablet Take 20 mg by mouth daily.     carvedilol (COREG) 25 MG tablet Take 25 mg by mouth 2 (two) times daily.     cholecalciferol (VITAMIN D) 25 MCG (1000 UNIT) tablet Take 1,000 Units by mouth in the morning.     docusate sodium (COLACE) 100 MG capsule Take 1 capsule (100 mg total) by mouth 2 (two) times daily as needed for mild constipation. 30 capsule 1   dorzolamide (TRUSOPT) 2 % ophthalmic solution Place 1 drop into both eyes 2 (two) times daily.     famotidine (PEPCID) 20 MG tablet Take 20 mg by mouth at bedtime.     glimepiride (AMARYL) 4 MG tablet Take 4 mg by mouth 2 (two) times daily.     hydrochlorothiazide (HYDRODIURIL) 25 MG tablet Take 25 mg by mouth daily.     HYDROcodone-acetaminophen (NORCO/VICODIN) 5-325 MG tablet Take 1-2 tablets by mouth every 4 (four) hours as needed for severe pain. 40 tablet 0   irbesartan (AVAPRO) 300 MG tablet Take 300 mg by mouth daily.     JARDIANCE 25 MG TABS tablet Take 25 mg by mouth daily.     latanoprost (XALATAN) 0.005 % ophthalmic solution Place 1 drop into both eyes at bedtime.     Multiple Vitamin (MULTIVITAMIN WITH MINERALS) TABS tablet Take 1 tablet by mouth in the morning. [employer]     polyethylene glycol (MIRALAX / GLYCOLAX) 17 g packet Take 17 g by mouth daily. 14 each 0   Probiotic Product (PROBIOTIC PO) Take 1 capsule by mouth in the morning.     timolol (TIMOPTIC) 0.5 % ophthalmic solution Place 1 drop into both eyes 2 (two) times daily.     No current facility-administered medications for this visit.    PHYSICAL EXAM Vitals:   05/26/22 1426  BP: 139/69  Pulse: (!) 58  Temp: 98.1 F (36.7 C)  Weight: 195 lb (88.5 kg)  Height: '5\' 9"'$  (1.753 m)     Constitutional: well appearing in no distress. Appears well nourished.  Neurologic: CN intact. Left leg weak with hip  flexion. Psychiatric: Mood and affect symmetric and appropriate. Eyes: No icterus. No conjunctival pallor. Ears, nose, throat: mucous membranes moist. Midline trachea.  Cardiac: regular rate and rhythm.  Respiratory: unlabored. Abdominal: soft, non-tender, non-distended.  Extremity: No edema. No cyanosis. No pallor.  Skin: No gangrene. No ulceration.  Lymphatic: No Stemmer's sign. No palpable lymphadenopathy.  PERTINENT LABORATORY AND RADIOLOGIC DATA  Bilateral lower extremity venous duplex RIGHT:  - No evidence of deep vein thrombosis in the lower extremity. No indirect  evidence  of obstruction proximal to the inguinal ligament.  - No cystic structure found in the popliteal fossa.  - Rouleau flow noted in the popliteal vein.     LEFT:  - Findings consistent with chronic deep vein thrombosis involving the left  femoral vein.  - No cystic structure found in the popliteal fossa.      *See table(s) above for measurements and observations.   [name]. Stanford Breed, MD [facility] Phone Number: [phone] 05/26/2022 5:02 PM  Total time spent on preparing this encounter including chart review, data review, collecting history, examining the patient, coordinating care for this established patient, 10 minutes.  Portions of this report may have been transcribed using voice recognition software.  Every effort has been made to ensure accuracy; however, inadvertent computerized transcription errors may still be present.

## 2022-05-26 ENCOUNTER — Ambulatory Visit (INDEPENDENT_AMBULATORY_CARE_PROVIDER_SITE_OTHER): Payer: Medicare HMO | Admitting: Vascular Surgery

## 2022-05-26 ENCOUNTER — Encounter: Payer: Self-pay | Admitting: Vascular Surgery

## 2022-05-26 VITALS — BP 139/69 | HR 58 | Temp 98.1°F | Ht 69.0 in | Wt 195.0 lb

## 2022-05-26 DIAGNOSIS — Z86718 Personal history of other venous thrombosis and embolism: Secondary | ICD-10-CM | POA: Diagnosis not present

## 2023-01-15 IMAGING — CR DG LUMBAR SPINE 2-3V
3 series · 3 of 3 positions shown · non-contrast
Comparison: 04/09/2022

CLINICAL DATA: L3-4 and L4-5 decompression

EXAM:
LUMBAR SPINE - 2-3 VIEW

[lateral (1 of 3)]
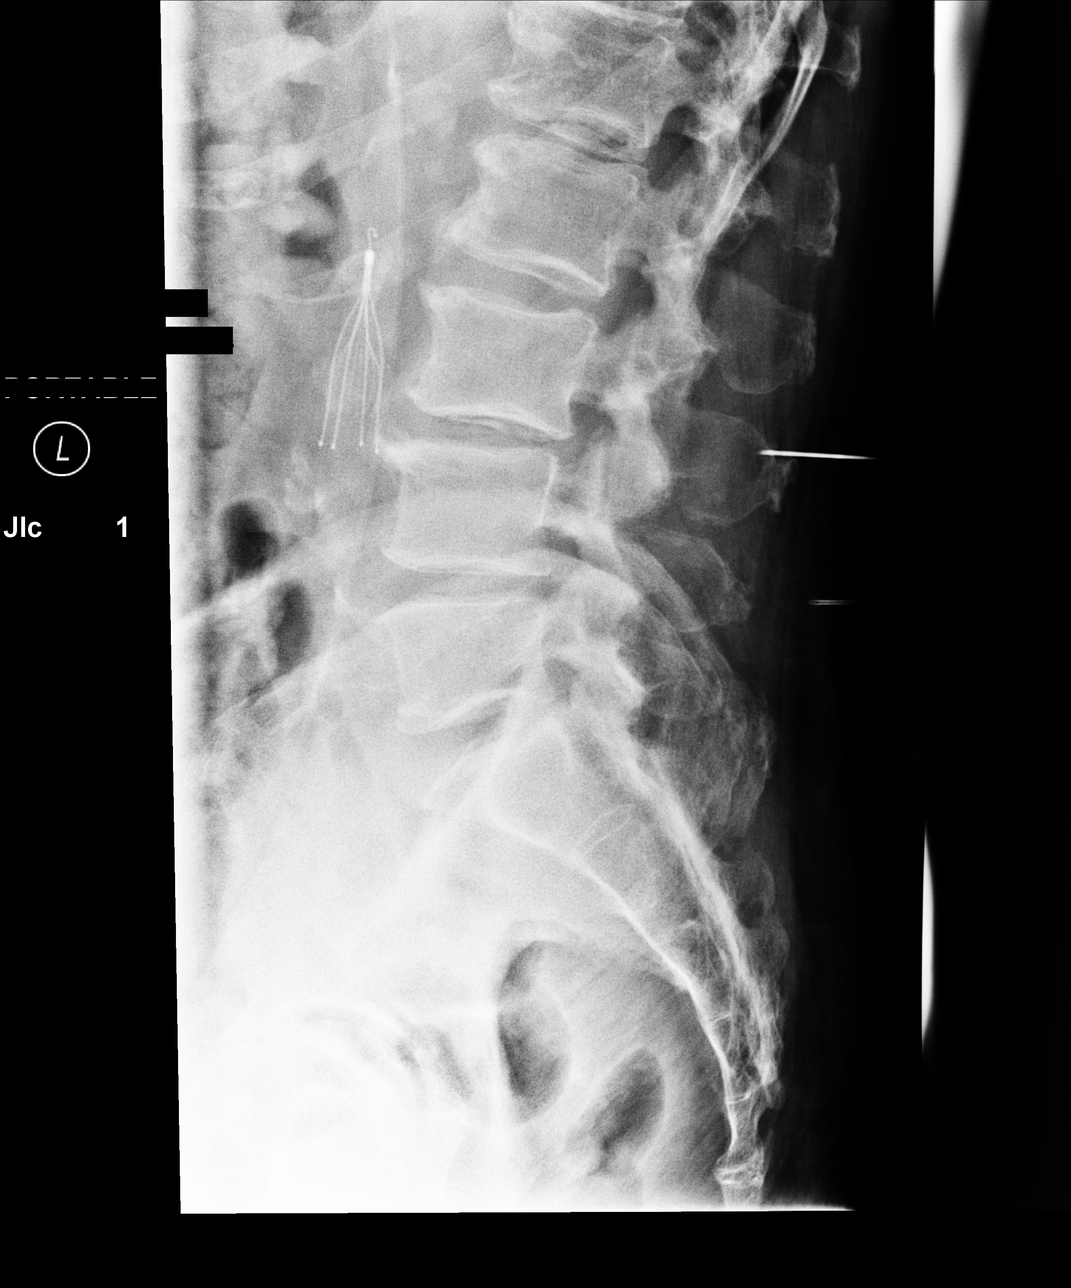

[lateral (2 of 3)]
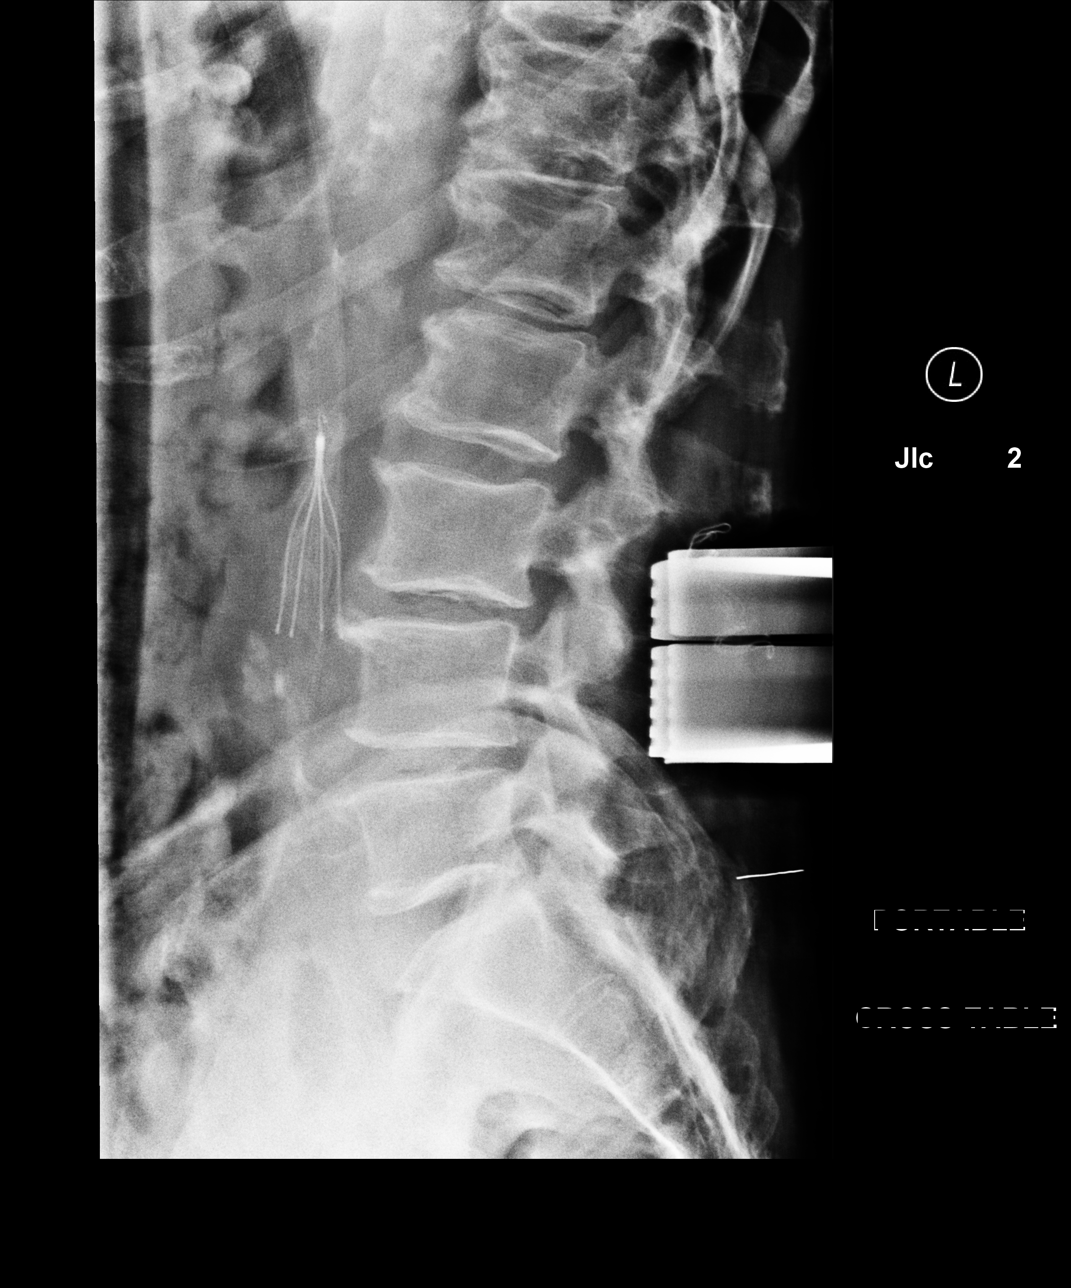

[lateral (3 of 3)]
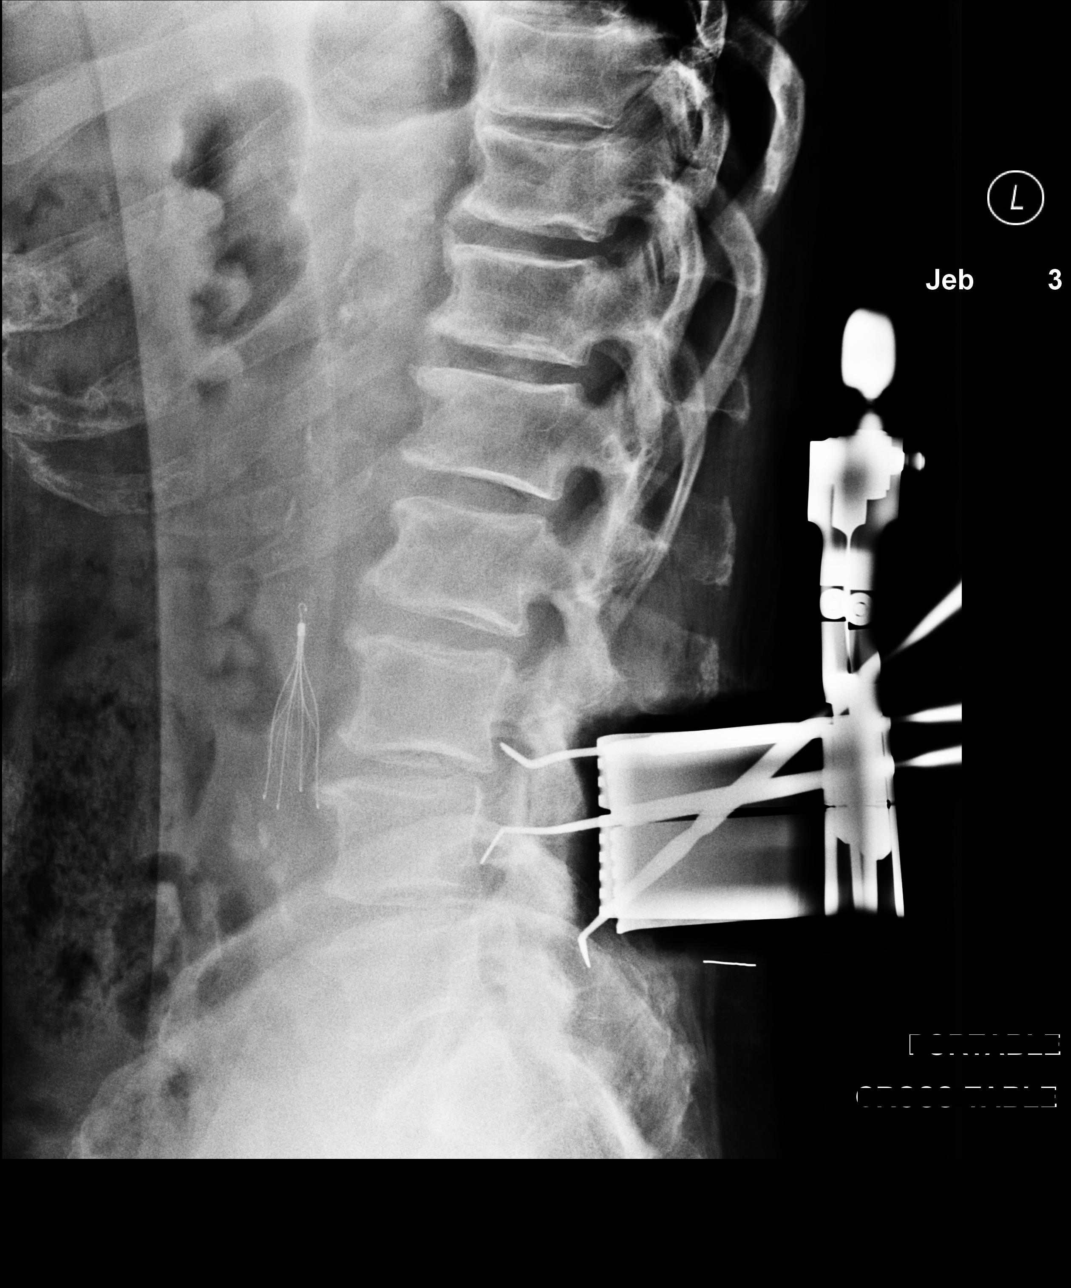

[3 of 3 positions shown; findings below may reference images not displayed]

FINDINGS: Three intraoperative cross-table lateral radiographs of the lumbar
spine were obtained. Film 1 demonstrates surgical probe posterior to
the L3-4 intervertebral disc level. Film 2 demonstrates surgical
instrumentation posterior to the L3-4 and L4-5 levels. Film 3
demonstrates surgical instrumentation adjacent to the L3-4 and L4-5
levels.
IMPRESSION: Surgical instrumentation posterior to the L3-4 and L4-5 levels.
